# Patient Record
Sex: Female | Born: 1990 | Race: Asian | Hispanic: No | Marital: Married | Smoking: Never smoker
Health system: Southern US, Community
[De-identification: ages and names within clinical notes are randomized; demographics above are authoritative.]

## PROBLEM LIST (undated history)

## (undated) DIAGNOSIS — L732 Hidradenitis suppurativa: Secondary | ICD-10-CM

## (undated) DIAGNOSIS — O09299 Supervision of pregnancy with other poor reproductive or obstetric history, unspecified trimester: Secondary | ICD-10-CM

## (undated) DIAGNOSIS — D563 Thalassemia minor: Secondary | ICD-10-CM

## (undated) HISTORY — PX: SKIN GRAFT: SHX250

---

## 2010-07-03 ENCOUNTER — Ambulatory Visit: Payer: Self-pay | Admitting: Surgery

## 2010-07-05 ENCOUNTER — Observation Stay: Payer: Self-pay | Admitting: Surgery

## 2010-08-06 ENCOUNTER — Ambulatory Visit: Payer: Self-pay | Admitting: Surgery

## 2010-09-17 ENCOUNTER — Encounter: Payer: Self-pay | Admitting: Nurse Practitioner

## 2010-09-17 ENCOUNTER — Encounter: Payer: Self-pay | Admitting: Cardiothoracic Surgery

## 2010-09-18 ENCOUNTER — Encounter: Payer: Self-pay | Admitting: Cardiothoracic Surgery

## 2010-09-18 ENCOUNTER — Encounter: Payer: Self-pay | Admitting: Nurse Practitioner

## 2010-10-30 ENCOUNTER — Encounter (HOSPITAL_BASED_OUTPATIENT_CLINIC_OR_DEPARTMENT_OTHER): Payer: Self-pay | Attending: Plastic Surgery

## 2010-11-26 ENCOUNTER — Encounter (HOSPITAL_BASED_OUTPATIENT_CLINIC_OR_DEPARTMENT_OTHER): Payer: Self-pay | Attending: Plastic Surgery

## 2013-05-19 LAB — OB RESULTS CONSOLE RUBELLA ANTIBODY, IGM: Rubella: IMMUNE

## 2013-05-19 LAB — OB RESULTS CONSOLE GC/CHLAMYDIA
Chlamydia: NEGATIVE
GC PROBE AMP, GENITAL: NEGATIVE

## 2013-05-19 LAB — OB RESULTS CONSOLE RPR: RPR: NONREACTIVE

## 2013-05-19 LAB — OB RESULTS CONSOLE HIV ANTIBODY (ROUTINE TESTING): HIV: NONREACTIVE

## 2013-05-19 LAB — OB RESULTS CONSOLE HEPATITIS B SURFACE ANTIGEN: Hepatitis B Surface Ag: NEGATIVE

## 2013-05-19 LAB — OB RESULTS CONSOLE ABO/RH: RH Type: POSITIVE

## 2013-08-31 ENCOUNTER — Inpatient Hospital Stay (HOSPITAL_COMMUNITY): Admission: AD | Admit: 2013-08-31 | Payer: Self-pay | Source: Ambulatory Visit | Admitting: Obstetrics & Gynecology

## 2013-10-06 ENCOUNTER — Other Ambulatory Visit: Payer: Self-pay

## 2013-10-11 ENCOUNTER — Ambulatory Visit (HOSPITAL_COMMUNITY)
Admission: RE | Admit: 2013-10-11 | Discharge: 2013-10-11 | Disposition: A | Discharge status: Home / Self Care | Payer: Medicaid Other | Source: Ambulatory Visit | Attending: Obstetrics and Gynecology | Admitting: Obstetrics and Gynecology

## 2013-10-11 DIAGNOSIS — D563 Thalassemia minor: Secondary | ICD-10-CM

## 2013-10-13 DIAGNOSIS — D563 Thalassemia minor: Secondary | ICD-10-CM | POA: Insufficient documentation

## 2013-10-13 NOTE — Progress Notes (Signed)
Genetic Counseling  High-Risk Gestation Note  Appointment Date:  10/11/2013 Referred By: Delice Lesch, MD Date of Birth:  Oct 07, 1990    Pregnancy History: G1P0 Estimated Date of Delivery: 12/18/13 Estimated Gestational Age: [redacted]w[redacted]d Attending: Renella Cunas, MD   I met with Mrs. 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 She was accompanied by her mother to today's visit.   Hayley Herring had routine screening for hemoglobinopathies via hemoglobin electrophoresis and complete blood count based on her ancestry on 05/19/13, which revealed a hemoglobin A of 93.7%, hemoglobin A2 of 4.4%, and hemoglobin F of 1.9%.  Her MCV value was 59.5 and her MCH was 20.8.  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 Additionally, the patient's complete blood count identified a hemoglobin value of 9.5. Females with beta-thalassemia minor are reported to typically have hemoglobin values that range from 9-14.  We reviewed both family histories in detail. Hayley Herring reported that she was aware of her beta-thalassemia carrier status, having learned about it when she previously had surgery in 2012. Her father reportedly has beta-thalassemia trait (beta-thalassemia minor). She reported that her paternal aunt and possibly her paternal grandfather also have beta-thalassemia trait. The father of the pregnancy, [name], reportedly has no relatives with beta-thalassemia trait or beta-thalassemia, and consanguinity between the couple was denied. The patient reported that [name] had blood work performed recently when he moved to the [place] from [place]. She reported that complete blood count was included in the blood work and was normal. She is unsure if hemoglobin electrophoresis was included as well, but reported that her  father learned about his beta-thalassemia trait through the lab work that was required when he moved from [place] to the [place]. Hayley Herring was unsure if [name] would have a copy of these medical records, given that all of the records are submitted to the [facility]. She planned to inquire and contact our office if medical records are obtained.    [name] was counseled that beta-thalassemia major is a genetic condition characterized by reduced synthesis of the beta subunit of hemoglobin (beta globin). We reviewed that hemoglobin is a protein in red blood cells that carries oxygen to the body's organs. There are multiple types of hemoglobin, and these are comprised of different subunits.  The primary adult hemoglobin, denoted as hemoglobin A (Hb A) is made up of two alpha and two beta globin units. Individuals who have beta-thalassemia major have changes within the genes that code for beta globin. The resulting imbalance in the ratio of the alpha chains to the beta chains causes the underlying pathology. In beta-thalassemia, the alpha chains are produced in relative excess. Therefore, because of the absence of the beta chains with which to form a tetramer, the excess alpha chains will precipitate in the cell damaging the membrane and leading to premature RBC destruction. This results in hypochromic, microcytic anemia. The anemia is severe and typically leads to hepatosplenomegaly for individuals with beta thalassemia.  Because the beta chain is not used for hemoglobin production during fetal life, the onset of symptoms in beta-thalassemia is not until a few months of life. Beta thalassemia major is typically treated with regular transfusions and chelation therapy, aimed at reducing transfusion iron overload.  Without treatment, individuals with beta-thalassemia major have failure to thrive, feeding problems, and a shortened lifespan. Elevation  of hemoglobin A2 (alpha2/delta2) occurs uniquely in  beta-thalassemia carriers, and this is what is typically found on hemoglobin electrophoresis for beta thalassemia carriers. Continued production of the delta chains allows for tetramer formation between the alpha and delta chains. Beta-thalassemia produces a variable phenotype dependent upon the severity of the mutations.    Hayley Herring was counseled that beta-thalassemia is inherited in an autosomal recessive manner, and occurs when both copies of the beta hemoglobin gene are changed. Typically, one abnormal beta globin gene is inherited from each parent. We discussed that beta thalassemia trait can be inherited with other beta globin chain variants, such as sickle cell trait (Hb AS) to also cause other variant hemoglobinopathies, such as sickle beta-thalassemia.    Given the recessive inheritance, we discussed the importance of understanding the carrier status of the father of the pregnancy in order to accurately predict the risk of beta-thalassemia or other hemoglobinopathy in the fetus. If he does not have beta-thalassemia trait, as Hayley Herring reports, then the pregnancy would not be at increased risk for beta-thalassemia major but would have a 1 in 2 (50%) chance to have beta-thalassemia trait (beta-thalassemia minor). If screening was not performed, hemoglobin electrophoresis with a complete blood count and serum ferritin levels would be available to him to determine whether he has beta-thalassemia trait, or any hemoglobin variant (including sickle cell trait).   We discussed that in the case that both parents are identified to be carriers for hemoglobin variants, prenatal diagnosis via chorionic villus sampling (in the first trimester) or amniocentesis (after [redacted] weeks gestation) would be available, if desired. In the case of beta-thalassemia trait, molecular testing would first need to be performed to identify the causative mutation in the HBB gene prior to CVS or amniocentesis, given that >200 pathogenic  variants have been identified in the HBB gene. We discussed that HBB gene sequencing is available to Ms. Jewel to assess for the causative gene change for her beta thalassemia trait. HBB sequencing reportedly identifies a causative mutation in 99% of individuals with beta thalassemia.  We discussed the risks, benefits, and limitations of amniocentesis. The patient understands that targeted ultrasound in pregnancy would not be expected to see physical differences related to the presence or absence of a hemoglobinopathy. We also reviewed the availability of newborn screening in [place] for hemoglobinopathies.  The family histories were otherwise unremarkable for birth defects, intellectual disability, recurrent pregnancy loss, or other known genetic conditions.   Ms. Noga Fogg denied exposure to environmental toxins or chemical agents. She denied the use of alcohol, tobacco or street drugs. She denied significant viral illnesses during the course of her pregnancy. Her medical and surgical histories were contributory for beta-thalassemia minor, as discussed and past surgery for hidradenitis in 2012 and 2013.   I counseled Ms. Najiyah Paris regarding the above risks and available options.  The approximate face-to-face time with the genetic counselor was 35 minutes.  Chipper Oman, MS Certified Genetic Counselor 10/13/2013

## 2013-11-21 LAB — OB RESULTS CONSOLE GBS: GBS: POSITIVE

## 2013-12-15 ENCOUNTER — Encounter (HOSPITAL_COMMUNITY): Payer: Self-pay | Admitting: *Deleted

## 2013-12-15 ENCOUNTER — Inpatient Hospital Stay (HOSPITAL_COMMUNITY)
Admission: AD | Admit: 2013-12-15 | Discharge: 2013-12-19 | DRG: 766 | Disposition: A | Discharge status: Home / Self Care | Payer: Medicaid Other | Source: Ambulatory Visit | Attending: Obstetrics and Gynecology | Admitting: Obstetrics and Gynecology

## 2013-12-15 ENCOUNTER — Inpatient Hospital Stay (HOSPITAL_COMMUNITY): Payer: Medicaid Other | Admitting: Anesthesiology

## 2013-12-15 ENCOUNTER — Encounter (HOSPITAL_COMMUNITY): Payer: Medicaid Other | Admitting: Anesthesiology

## 2013-12-15 DIAGNOSIS — Z3A39 39 weeks gestation of pregnancy: Secondary | ICD-10-CM | POA: Diagnosis present

## 2013-12-15 DIAGNOSIS — Z6836 Body mass index (BMI) 36.0-36.9, adult: Secondary | ICD-10-CM | POA: Diagnosis not present

## 2013-12-15 DIAGNOSIS — O471 False labor at or after 37 completed weeks of gestation: Secondary | ICD-10-CM | POA: Diagnosis present

## 2013-12-15 DIAGNOSIS — L732 Hidradenitis suppurativa: Secondary | ICD-10-CM | POA: Diagnosis present

## 2013-12-15 DIAGNOSIS — D563 Thalassemia minor: Secondary | ICD-10-CM | POA: Diagnosis present

## 2013-12-15 DIAGNOSIS — O99824 Streptococcus B carrier state complicating childbirth: Secondary | ICD-10-CM | POA: Diagnosis present

## 2013-12-15 DIAGNOSIS — O99214 Obesity complicating childbirth: Secondary | ICD-10-CM | POA: Diagnosis present

## 2013-12-15 DIAGNOSIS — O9902 Anemia complicating childbirth: Secondary | ICD-10-CM | POA: Diagnosis present

## 2013-12-15 DIAGNOSIS — Z833 Family history of diabetes mellitus: Secondary | ICD-10-CM

## 2013-12-15 DIAGNOSIS — B951 Streptococcus, group B, as the cause of diseases classified elsewhere: Secondary | ICD-10-CM | POA: Diagnosis present

## 2013-12-15 HISTORY — DX: Hidradenitis suppurativa: L73.2

## 2013-12-15 HISTORY — DX: Thalassemia minor: D56.3

## 2013-12-15 LAB — CBC
HCT: 30.2 % — ABNORMAL LOW (ref 36.0–46.0)
Hemoglobin: 9.9 g/dL — ABNORMAL LOW (ref 12.0–15.0)
MCH: 20.3 pg — ABNORMAL LOW (ref 26.0–34.0)
MCHC: 32.8 g/dL (ref 30.0–36.0)
MCV: 62 fL — ABNORMAL LOW (ref 78.0–100.0)
PLATELETS: 287 10*3/uL (ref 150–400)
RBC: 4.87 MIL/uL (ref 3.87–5.11)
RDW: 16.4 % — ABNORMAL HIGH (ref 11.5–15.5)
WBC: 15.5 10*3/uL — ABNORMAL HIGH (ref 4.0–10.5)

## 2013-12-15 LAB — HIV ANTIBODY (ROUTINE TESTING W REFLEX): HIV: NONREACTIVE

## 2013-12-15 LAB — TYPE AND SCREEN
ABO/RH(D): AB POS
ANTIBODY SCREEN: NEGATIVE

## 2013-12-15 LAB — RPR

## 2013-12-15 LAB — ABO/RH: ABO/RH(D): AB POS

## 2013-12-15 LAB — AMNISURE RUPTURE OF MEMBRANE (ROM) NOT AT ARMC: Amnisure ROM: POSITIVE

## 2013-12-15 MED ORDER — EPHEDRINE 5 MG/ML INJ: 10.0000 mg | INTRAVENOUS | Status: DC | PRN

## 2013-12-15 MED ORDER — LACTATED RINGERS IV SOLN
INTRAVENOUS | Status: DC
  Administered 2013-12-15: 11:00:00 via INTRAVENOUS
  Administered 2013-12-15: 125 mL/h via INTRAVENOUS
  Administered 2013-12-16 (×3): via INTRAVENOUS

## 2013-12-15 MED ORDER — TERBUTALINE SULFATE 1 MG/ML IJ SOLN: 0.2500 mg | Freq: Once | INTRAMUSCULAR | Status: AC | PRN

## 2013-12-15 MED ORDER — OXYTOCIN BOLUS FROM INFUSION: 500.0000 mL | INTRAVENOUS | Status: DC

## 2013-12-15 MED ORDER — CITRIC ACID-SODIUM CITRATE 334-500 MG/5ML PO SOLN
30.0000 mL | ORAL | Status: DC | PRN
  Administered 2013-12-16: 30 mL via ORAL
  Filled 2013-12-15 (×2): qty 15

## 2013-12-15 MED ORDER — ONDANSETRON HCL 4 MG/2ML IJ SOLN: 4.0000 mg | Freq: Four times a day (QID) | INTRAMUSCULAR | Status: DC | PRN

## 2013-12-15 MED ORDER — BUTORPHANOL TARTRATE 1 MG/ML IJ SOLN
1.0000 mg | INTRAMUSCULAR | Status: DC | PRN
  Administered 2013-12-15 (×2): 1 mg via INTRAVENOUS
  Filled 2013-12-15 (×2): qty 1

## 2013-12-15 MED ORDER — LACTATED RINGERS IV SOLN
500.0000 mL | INTRAVENOUS | Status: DC | PRN
  Administered 2013-12-16: 500 mL via INTRAVENOUS

## 2013-12-15 MED ORDER — OXYCODONE-ACETAMINOPHEN 5-325 MG PO TABS: 2.0000 | ORAL_TABLET | ORAL | Status: DC | PRN

## 2013-12-15 MED ORDER — OXYTOCIN 40 UNITS IN LACTATED RINGERS INFUSION - SIMPLE MED
1.0000 m[IU]/min | INTRAVENOUS | Status: DC
  Administered 2013-12-15: 1 m[IU]/min via INTRAVENOUS

## 2013-12-15 MED ORDER — LIDOCAINE HCL (PF) 1 % IJ SOLN
INTRAMUSCULAR | Status: DC | PRN
  Administered 2013-12-15: 6 mL
  Administered 2013-12-15: 4 mL

## 2013-12-15 MED ORDER — OXYCODONE-ACETAMINOPHEN 5-325 MG PO TABS: 1.0000 | ORAL_TABLET | ORAL | Status: DC | PRN

## 2013-12-15 MED ORDER — ACETAMINOPHEN 325 MG PO TABS: 650.0000 mg | ORAL_TABLET | ORAL | Status: DC | PRN

## 2013-12-15 MED ORDER — MISOPROSTOL 25 MCG QUARTER TABLET: 25.0000 ug | ORAL_TABLET | ORAL | Status: DC | PRN

## 2013-12-15 MED ORDER — PHENYLEPHRINE 40 MCG/ML (10ML) SYRINGE FOR IV PUSH (FOR BLOOD PRESSURE SUPPORT)
80.0000 ug | PREFILLED_SYRINGE | INTRAVENOUS | Status: DC | PRN
  Filled 2013-12-15: qty 10

## 2013-12-15 MED ORDER — PENICILLIN G POTASSIUM 5000000 UNITS IJ SOLR
2.5000 10*6.[IU] | INTRAVENOUS | Status: DC
  Administered 2013-12-15 – 2013-12-16 (×5): 2.5 10*6.[IU] via INTRAVENOUS
  Filled 2013-12-15 (×8): qty 2.5

## 2013-12-15 MED ORDER — PHENYLEPHRINE 40 MCG/ML (10ML) SYRINGE FOR IV PUSH (FOR BLOOD PRESSURE SUPPORT): 80.0000 ug | PREFILLED_SYRINGE | INTRAVENOUS | Status: DC | PRN

## 2013-12-15 MED ORDER — LIDOCAINE HCL (PF) 1 % IJ SOLN
30.0000 mL | INTRAMUSCULAR | Status: DC | PRN
  Filled 2013-12-15: qty 30

## 2013-12-15 MED ORDER — OXYTOCIN 40 UNITS IN LACTATED RINGERS INFUSION - SIMPLE MED
62.5000 mL/h | INTRAVENOUS | Status: DC
  Filled 2013-12-15: qty 1000

## 2013-12-15 MED ORDER — FLEET ENEMA 7-19 GM/118ML RE ENEM: 1.0000 | ENEMA | RECTAL | Status: DC | PRN

## 2013-12-15 MED ORDER — FENTANYL 2.5 MCG/ML BUPIVACAINE 1/10 % EPIDURAL INFUSION (WH - ANES)
14.0000 mL/h | INTRAMUSCULAR | Status: DC | PRN
  Administered 2013-12-15 – 2013-12-16 (×3): 14 mL/h via EPIDURAL
  Filled 2013-12-15 (×4): qty 125

## 2013-12-15 MED ORDER — DIPHENHYDRAMINE HCL 50 MG/ML IJ SOLN: 12.5000 mg | INTRAMUSCULAR | Status: DC | PRN

## 2013-12-15 MED ORDER — ZOLPIDEM TARTRATE 5 MG PO TABS: 5.0000 mg | ORAL_TABLET | Freq: Every evening | ORAL | Status: DC | PRN

## 2013-12-15 MED ORDER — FENTANYL 2.5 MCG/ML BUPIVACAINE 1/10 % EPIDURAL INFUSION (WH - ANES): 14.0000 mL/h | INTRAMUSCULAR | Status: DC | PRN

## 2013-12-15 MED ORDER — LACTATED RINGERS IV SOLN
500.0000 mL | Freq: Once | INTRAVENOUS | Status: AC
  Administered 2013-12-15: 500 mL via INTRAVENOUS

## 2013-12-15 MED ORDER — PENICILLIN G POTASSIUM 5000000 UNITS IJ SOLR
5.0000 10*6.[IU] | Freq: Once | INTRAVENOUS | Status: AC
  Administered 2013-12-15: 5 10*6.[IU] via INTRAVENOUS
  Filled 2013-12-15: qty 5

## 2013-12-15 NOTE — Progress Notes (Signed)
Hayley Herring MRN: [id number]  Subjective: -Patient continues to experience pain in left leg.   Objective: BP 138/81  Pulse 93  Temp(Src) 99.1 F (37.3 C) (Axillary)  Resp 18  Ht 4\' 11"  (1.499 m)  Wt 179 lb (81.194 kg)  BMI 36.13 kg/m2  SpO2 97%   Total I/O In: -  Out: 650 [Urine:650] FHT: 150 bpm, Mod Var, -Decels, +Accels UC:  Q2-10min, MVUs 160, palpates moderate to strong  SVE:   Dilation: 8 Effacement (%): 90 Station: -2 Exam by:: Colie Josten cnm Membranes:SROM x 16hrs Pitocin:86mUn/min  Assessment:  IUP at 39.4wks Cat I FT  GBS Positive Active Labor Pitocin Augmentation  Plan: -Position change to promote fetal descent -Continue other mgmt as ordered -Will continue to observe  -Will update Dr. Berenda Morale, Francesca Jewett, MSN 12/15/2013, 11:32 PM

## 2013-12-15 NOTE — Progress Notes (Signed)
  Subjective: Increased pain with UCs, but UCs still irregular.  Patient also has difficulty with pelvic exams.  Has received 2 doses IV pain med with brief relief.  Objective: BP 133/83  Pulse 78  Temp(Src) 98.4 F (36.9 C) (Oral)  Resp 18  Ht 4\' 11"  (1.499 m)  Wt 179 lb (81.194 kg)  BMI 36.13 kg/m2      FHT: Category 1 UC:   irregular, every 3-6 minutes SVE:   Dilation: 3.5 Effacement (%): 90 Station: -2 Exam by:: v.Teffany Blaszczyk,RN    Assessment:  Early labor GBS positive SROM at 7am  Plan: Reviewed status with patient and family--I recommend pitocin and epidural analgesia. Patient and family agreeable with plan.  Donnel Saxon CNM 12/15/2013, 3:17 PM

## 2013-12-15 NOTE — Progress Notes (Signed)
Hayley Herring MRN: [id number]  Subjective: -Patient reports some discomfort in left leg.  PCA dose given and repositioned.    Objective: BP 118/61  Pulse 64  Temp(Src) 98 F (36.7 C) (Oral)  Resp 18  Ht 4\' 11"  (1.499 m)  Wt 179 lb (81.194 kg)  BMI 36.13 kg/m2  SpO2 97%     FHT: 145 bpm, Mod Var, + Variable Decels, +Accels UC:   Q2-41min, MVUs 180 SVE:   Dilation: 6 Effacement (%): 90 Station: -2 Exam by:: Hayley Herring cnm Membranes: SROM x 14hrs Pitocin: 11mUn/min IUPC in place Pelvis; Narrow Outlet  Assessment:  IUP at 39.4wks Cat II FT  GBS Positive Active Labor  Plan: -Cat II FT resolved with position change -Discussed narrow pelvic outlet--assured patient that will provide every opportunity for vaginal delivery -Patient questions and expresses concern re; whether she will have c/s, educated on uncertainty of delivery mode outcome, but remain hopeful and supportive of vaginal delivery.  Patient reassured by this. -Will continue to observe -Continue other mgmt as ordered  [facility], Hayley Herring,CNM, MSN 12/15/2013, 8:56 PM

## 2013-12-15 NOTE — MAU Note (Signed)
Started leaking white clear discharge @ 0700, lower abd cramping every 7 minutes.  Leaking continues, is thick "gooey."  Denies bleeding.

## 2013-12-15 NOTE — Anesthesia Procedure Notes (Signed)

## 2013-12-15 NOTE — Anesthesia Preprocedure Evaluation (Addendum)
Anesthesia Evaluation  Patient identified by MRN, date of birth, ID band Patient awake    Reviewed: Allergy & Precautions, H&P , Patient's Chart, lab work & pertinent test results  Airway Mallampati: II  TM Distance: >3 FB Neck ROM: full    Dental  (+) Teeth Intact   Pulmonary  breath sounds clear to auscultation        Cardiovascular Rhythm:regular Rate:Normal     Neuro/Psych    GI/Hepatic   Endo/Other  Morbid obesity  Renal/GU      Musculoskeletal   Abdominal   Peds  Hematology  (+) anemia ,   Anesthesia Other Findings       Reproductive/Obstetrics (+) Pregnancy                             Anesthesia Physical Anesthesia Plan  ASA: III and emergent  Anesthesia Plan: Epidural   Post-op Pain Management:    Induction:   Airway Management Planned: Natural Airway  Additional Equipment:   Intra-op Plan:   Post-operative Plan:   Informed Consent: I have reviewed the patients History and Physical, chart, labs and discussed the procedure including the risks, benefits and alternatives for the proposed anesthesia with the patient or authorized representative who has indicated his/her understanding and acceptance.   Dental Advisory Given  Plan Discussed with: Anesthesiologist, CRNA and Surgeon  Anesthesia Plan Comments: (Labs checked- platelets confirmed with RN in room. Fetal heart tracing, per RN, reported to be stable enough for sitting procedure. Discussed epidural, and patient consents to the procedure:  included risk of possible headache,backache, failed block, allergic reaction, and nerve injury. This patient was asked if she had any questions or concerns before the procedure started. Patient for C/Section for arrest of descent. Will use epidural for C/Section. M. Royce Macadamia, MD)       Anesthesia Quick Evaluation

## 2013-12-15 NOTE — H&P (Signed)
Hayley Herring is a 23 y.o. female, G1P0 at 72 4/7 weeks, presenting for contractions since 7am, with ? SROM at that time.  Denies bleeding, reports d/c is thick and mucusy.  Reports +FM.  Amnisure was positive in MAU, now admitted to [facility] for labor care.  Patient Active Problem List   Diagnosis Date Noted  . Positive GBS test 12/15/2013  . Hydradenitis 12/15/2013  . Thalassemia trait, 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    History of present pregnancy: Patient entered care at 22-23 weeks as transfer from [facility] due to insurance issues.   EDC of 12/18/13 was established by LMP and in agreement with [place] at 18 1/7 weeks.   Anatomy scan:  18 1/7 weeks, with normal findings and a posterior placenta.   Additional [place] evaluations:  None   Significant prenatal events:  Several episodes of hydradenitis suppurativa during pregnancy, with extensive hx of this in past.  Areas generally open and drain spontaneously, has not required ATB during pregnancy. Last evaluation:  12/12/13--cervix closed, hydradenitis stable, single lesion draining small amount.  OB History   Grav Para Term Preterm Abortions TAB SAB Ect Mult Living   1              Past Medical History  Diagnosis Date  . Beta thalassemia minor   . Hidradenitis suppurativa    Surgical Hx:  Drainage of hydradenitis lesions in 2012 and 2014--removal of gland  Family History: MA esophageal cancer; Father migraines; Mother Type 2 DM  Social History:  reports that she has never smoked. She does not have any smokeless tobacco history on file. She reports that she does not drink alcohol or use illicit drugs.Patient is [place], married to FOB [facility]), is full-time Ship broker, Muslim in religion.   Prenatal Transfer Tool  Maternal Diabetes: No Genetic Screening: Normal Maternal Ultrasounds/Referrals: Normal Quad screen Fetal Ultrasounds or other Referrals:  None Maternal Substance Abuse:  No Significant Maternal Medications:   None Significant Maternal Lab Results: Lab values include: Group B Strep positive; Beta thalassemia minor (FOB not sure if he has been tested, but no hx of beta thalassemia in his hx)  TDAP 10/01/13 Flu vaccine 11/21/13   ROS:  Contractions, + leaking fluid, +FM.  No Known Allergies   Dilation: 3 Effacement (%): 90 Station: -2 Exam by:: VCira Servant CNM Blood pressure 115/65, pulse 92, temperature 98.4 F (36.9 C), temperature source Oral, resp. rate 20.  Chest clear Heart RRR without murmur Abd gravid, NT, FH 39 cm Pelvic: As above--draining hydradenitis lesion on right labia majora, NT.  Right labia indurated, NT Ext: WNL  FHR: Category 1 UCs:  q 3-6 min, mild/moderate  Prenatal labs: ABO, Rh:  AB+ Antibody:  Neg Rubella:   Immune RPR:   NR HBsAg:   Neg HIV:   NR GBS: Positive (10/05 0000) Sickle cell/Hgb electrophoresis:  Beta thalassemia minor Pap:  WNL 08/09/13 GC:  Negative 08/09/13 and 11/21/13 Chlamydia:  Negative 08/09/13 and 11/21/13 Genetic screenings:  Normal Quad screen at [place]:  WNL Other:  Hgb 8.9 on 10/03/13, 9.5 on 05/19/13.    Assessment/Plan: IUP at 39 4/7 weeks Early labor SROM Hydratinitis suppuritiva GBS positive  Plan: Admit to [place] per consult with Dr. Raphael Gibney Routine [facility] orders T&S with admit labs GBS prophylaxis with PCN G. Pain med/epidural prn. Patient wants to ambulate initially for pain management.   [place], [place], [place] 12/15/2013, 10:24 AM

## 2013-12-15 NOTE — Progress Notes (Signed)
  Subjective: Still holding in MAU due to no bed space in MAU.  Has been ambulating.  Received 1st dose of PCN at 1130.  Objective: BP 115/65  Pulse 83  Temp(Src) 98 F (36.7 C) (Oral)  Resp 18      FHT: Category 1 on current tracing--intermittent monitoring during ambulation. UC:   irregular, every 4-6 min SVE:   Dilation: 3 Effacement (%): 90 Station: -2 Exam by:: Windy Kalata CNM at 1012  Assessment:  SROM at 7a, latent/early labor GBS positive  Plan: Await bed on [place]. Augmentation if no increase in labor status by 3 pm.  Donnel Saxon CNM 12/15/2013, 12:21 PM

## 2013-12-15 NOTE — Progress Notes (Signed)
  Subjective: Comfortable with epidural.  Sleeping at intervals.  Objective: BP 125/73  Pulse 80  Temp(Src) 98.4 F (36.9 C) (Oral)  Resp 20  Ht 4\' 11"  (1.499 m)  Wt 179 lb (81.194 kg)  BMI 36.13 kg/m2  SpO2 97%      Results for orders placed during the hospital encounter of 12/15/13 (from the past 24 hour(s))  AMNISURE RUPTURE OF MEMBRANE (ROM)     Status: None   Collection Time    12/15/13 10:13 AM      Result Value Ref Range   Amnisure ROM POSITIVE    TYPE AND SCREEN     Status: None   Collection Time    12/15/13 11:00 AM      Result Value Ref Range   ABO/RH(D) AB POS     Antibody Screen NEG     Sample Expiration 12/18/2013    HIV ANTIBODY (ROUTINE TESTING)     Status: None   Collection Time    12/15/13 11:00 AM      Result Value Ref Range   HIV 1&2 Ab, 4th Generation NONREACTIVE  NONREACTIVE  CBC     Status: Abnormal   Collection Time    12/15/13 11:00 AM      Result Value Ref Range   WBC 15.5 (*) 4.0 - 10.5 K/uL   RBC 4.87  3.87 - 5.11 MIL/uL   Hemoglobin 9.9 (*) 12.0 - 15.0 g/dL   HCT 30.2 (*) 36.0 - 46.0 %   MCV 62.0 (*) 78.0 - 100.0 fL   MCH 20.3 (*) 26.0 - 34.0 pg   MCHC 32.8  30.0 - 36.0 g/dL   RDW 16.4 (*) 11.5 - 15.5 %   Platelets 287  150 - 400 K/uL  RPR     Status: None   Collection Time    12/15/13 11:00 AM      Result Value Ref Range   RPR NON REAC  NON REAC  ABO/RH     Status: None   Collection Time    12/15/13 11:00 AM      Result Value Ref Range   ABO/RH(D) AB POS       FHT: Category 1 UC:   irregular, every 2-5 minutes SVE:   Dilation: 4.5 Effacement (%): 90 Station: -2 Exam by:: V.Lilas Diefendorf,CNM IUPC placed without difficulty Foley placed without difficulty Pelvic outlet narrow. Pitocin at 4 mu/min  Assessment:  Early labor GBS positive SROM since 7am Beta thalassemia minor--Hgb 9.9  Plan: Continue augmentation to establish/maintain adequacy.  Donnel Saxon CNM 12/15/2013, 6:17 PM

## 2013-12-16 ENCOUNTER — Encounter (HOSPITAL_COMMUNITY)
Admission: AD | Disposition: A | Discharge status: Home / Self Care | Payer: Self-pay | Source: Ambulatory Visit | Attending: Obstetrics and Gynecology

## 2013-12-16 ENCOUNTER — Encounter (HOSPITAL_COMMUNITY): Payer: Self-pay | Admitting: *Deleted

## 2013-12-16 LAB — COMPREHENSIVE METABOLIC PANEL
ALBUMIN: 2.5 g/dL — AB (ref 3.5–5.2)
ALK PHOS: 148 U/L — AB (ref 39–117)
ALT: 10 U/L (ref 0–35)
AST: 19 U/L (ref 0–37)
Anion gap: 14 (ref 5–15)
BILIRUBIN TOTAL: 0.5 mg/dL (ref 0.3–1.2)
BUN: 6 mg/dL (ref 6–23)
CO2: 20 mEq/L (ref 19–32)
Calcium: 8.7 mg/dL (ref 8.4–10.5)
Chloride: 104 mEq/L (ref 96–112)
Creatinine, Ser: 0.73 mg/dL (ref 0.50–1.10)
GFR calc Af Amer: >90 mL/min (ref >90)
GFR calc non Af Amer: >90 mL/min (ref >90)
Glucose, Bld: 104 mg/dL — ABNORMAL HIGH (ref 70–99)
POTASSIUM: 3.9 meq/L (ref 3.7–5.3)
SODIUM: 138 meq/L (ref 137–147)
Total Protein: 6.6 g/dL (ref 6.0–8.3)

## 2013-12-16 LAB — PROTEIN / CREATININE RATIO, URINE
Creatinine, Urine: 64.32 mg/dL
PROTEIN CREATININE RATIO: 5.39 — AB (ref 0.00–0.15)
TOTAL PROTEIN, URINE: 346.5 mg/dL

## 2013-12-16 LAB — URIC ACID: Uric Acid, Serum: 5 mg/dL (ref 2.4–7.0)

## 2013-12-16 LAB — LACTATE DEHYDROGENASE: LDH: 175 U/L (ref 94–250)

## 2013-12-16 SURGERY — Surgical Case
Anesthesia: Epidural

## 2013-12-16 MED ORDER — KETOROLAC TROMETHAMINE 30 MG/ML IJ SOLN
30.0000 mg | Freq: Four times a day (QID) | INTRAMUSCULAR | Status: AC | PRN
Start: 2013-12-16 — End: 2013-12-17
  Administered 2013-12-16: 30 mg via INTRAMUSCULAR

## 2013-12-16 MED ORDER — SCOPOLAMINE 1 MG/3DAYS TD PT72
1.0000 | MEDICATED_PATCH | Freq: Once | TRANSDERMAL | Status: AC
  Administered 2013-12-16: 1.5 mg via TRANSDERMAL

## 2013-12-16 MED ORDER — KETOROLAC TROMETHAMINE 30 MG/ML IJ SOLN: 30.0000 mg | Freq: Four times a day (QID) | INTRAMUSCULAR | Status: AC | PRN

## 2013-12-16 MED ORDER — NALBUPHINE HCL 10 MG/ML IJ SOLN: 5.0000 mg | Freq: Once | INTRAMUSCULAR | Status: AC | PRN

## 2013-12-16 MED ORDER — BUPIVACAINE-EPINEPHRINE (PF) 0.5% -1:200000 IJ SOLN
INTRAMUSCULAR | Status: AC
  Filled 2013-12-16: qty 30

## 2013-12-16 MED ORDER — ONDANSETRON HCL 4 MG/2ML IJ SOLN: 4.0000 mg | INTRAMUSCULAR | Status: DC | PRN

## 2013-12-16 MED ORDER — TETANUS-DIPHTH-ACELL PERTUSSIS 5-2.5-18.5 LF-MCG/0.5 IM SUSP: 0.5000 mL | Freq: Once | INTRAMUSCULAR | Status: DC

## 2013-12-16 MED ORDER — SODIUM CHLORIDE 0.9 % IR SOLN
Status: DC | PRN
  Administered 2013-12-16: 1000 mL

## 2013-12-16 MED ORDER — PRENATAL MULTIVITAMIN CH
1.0000 | ORAL_TABLET | Freq: Every day | ORAL | Status: DC
  Administered 2013-12-17 – 2013-12-19 (×3): 1 via ORAL
  Filled 2013-12-16 (×3): qty 1

## 2013-12-16 MED ORDER — SODIUM BICARBONATE 8.4 % IV SOLN
INTRAVENOUS | Status: AC
  Filled 2013-12-16: qty 50

## 2013-12-16 MED ORDER — ZOLPIDEM TARTRATE 5 MG PO TABS: 5.0000 mg | ORAL_TABLET | Freq: Every evening | ORAL | Status: DC | PRN

## 2013-12-16 MED ORDER — PRENATAL MULTIVITAMIN CH: 1.0000 | ORAL_TABLET | Freq: Every day | ORAL | Status: DC

## 2013-12-16 MED ORDER — MEASLES, MUMPS & RUBELLA VAC ~~LOC~~ INJ: 0.5000 mL | INJECTION | Freq: Once | SUBCUTANEOUS | Status: DC

## 2013-12-16 MED ORDER — LANOLIN HYDROUS EX OINT: 1.0000 "application " | TOPICAL_OINTMENT | CUTANEOUS | Status: DC | PRN

## 2013-12-16 MED ORDER — OXYTOCIN 10 UNIT/ML IJ SOLN
INTRAMUSCULAR | Status: AC
  Filled 2013-12-16: qty 4

## 2013-12-16 MED ORDER — LACTATED RINGERS IV SOLN
INTRAVENOUS | Status: DC
  Administered 2013-12-16 (×2): via INTRAVENOUS

## 2013-12-16 MED ORDER — MEDROXYPROGESTERONE ACETATE 150 MG/ML IM SUSP: 150.0000 mg | INTRAMUSCULAR | Status: DC | PRN

## 2013-12-16 MED ORDER — CEFAZOLIN SODIUM-DEXTROSE 2-3 GM-% IV SOLR
INTRAVENOUS | Status: DC | PRN
  Administered 2013-12-16: 2 g via INTRAVENOUS

## 2013-12-16 MED ORDER — OXYCODONE-ACETAMINOPHEN 5-325 MG PO TABS
1.0000 | ORAL_TABLET | ORAL | Status: DC | PRN
  Administered 2013-12-18 (×2): 1 via ORAL
  Filled 2013-12-16 (×2): qty 1

## 2013-12-16 MED ORDER — MORPHINE SULFATE 0.5 MG/ML IJ SOLN
INTRAMUSCULAR | Status: AC
  Filled 2013-12-16: qty 10

## 2013-12-16 MED ORDER — ONDANSETRON HCL 4 MG/2ML IJ SOLN
INTRAMUSCULAR | Status: DC | PRN
  Administered 2013-12-16: 4 mg via INTRAVENOUS

## 2013-12-16 MED ORDER — SODIUM CHLORIDE 0.9 % IJ SOLN: 3.0000 mL | INTRAMUSCULAR | Status: DC | PRN

## 2013-12-16 MED ORDER — OXYTOCIN 10 UNIT/ML IJ SOLN
40.0000 [IU] | INTRAVENOUS | Status: DC | PRN
  Administered 2013-12-16: 40 [IU] via INTRAVENOUS

## 2013-12-16 MED ORDER — ONDANSETRON HCL 4 MG/2ML IJ SOLN: 4.0000 mg | Freq: Three times a day (TID) | INTRAMUSCULAR | Status: DC | PRN

## 2013-12-16 MED ORDER — MENTHOL 3 MG MT LOZG
1.0000 | LOZENGE | OROMUCOSAL | Status: DC | PRN
Start: 2013-12-16 — End: 2013-12-19

## 2013-12-16 MED ORDER — KETOROLAC TROMETHAMINE 30 MG/ML IJ SOLN
INTRAMUSCULAR | Status: AC
  Administered 2013-12-16: 30 mg via INTRAMUSCULAR
  Filled 2013-12-16: qty 1

## 2013-12-16 MED ORDER — WITCH HAZEL-GLYCERIN EX PADS
1.0000 | MEDICATED_PAD | CUTANEOUS | Status: DC | PRN
Start: 2013-12-16 — End: 2013-12-19

## 2013-12-16 MED ORDER — NALBUPHINE HCL 10 MG/ML IJ SOLN: 5.0000 mg | INTRAMUSCULAR | Status: DC | PRN

## 2013-12-16 MED ORDER — SIMETHICONE 80 MG PO CHEW
80.0000 mg | CHEWABLE_TABLET | ORAL | Status: DC
  Administered 2013-12-17 – 2013-12-19 (×3): 80 mg via ORAL
  Filled 2013-12-16 (×3): qty 1

## 2013-12-16 MED ORDER — OXYTOCIN 40 UNITS IN LACTATED RINGERS INFUSION - SIMPLE MED: 62.5000 mL/h | INTRAVENOUS | Status: AC

## 2013-12-16 MED ORDER — IBUPROFEN 600 MG PO TABS
600.0000 mg | ORAL_TABLET | Freq: Four times a day (QID) | ORAL | Status: DC
  Administered 2013-12-16 – 2013-12-19 (×12): 600 mg via ORAL
  Filled 2013-12-16 (×12): qty 1

## 2013-12-16 MED ORDER — MORPHINE SULFATE (PF) 0.5 MG/ML IJ SOLN
INTRAMUSCULAR | Status: DC | PRN
  Administered 2013-12-16: 4 mg via EPIDURAL

## 2013-12-16 MED ORDER — FENTANYL CITRATE 0.05 MG/ML IJ SOLN: 25.0000 ug | INTRAMUSCULAR | Status: DC | PRN

## 2013-12-16 MED ORDER — NALOXONE HCL 1 MG/ML IJ SOLN: 1.0000 ug/kg/h | INTRAVENOUS | Status: DC | PRN

## 2013-12-16 MED ORDER — SCOPOLAMINE 1 MG/3DAYS TD PT72
MEDICATED_PATCH | TRANSDERMAL | Status: AC
  Administered 2013-12-16: 1.5 mg via TRANSDERMAL
  Filled 2013-12-16: qty 1

## 2013-12-16 MED ORDER — SENNOSIDES-DOCUSATE SODIUM 8.6-50 MG PO TABS
2.0000 | ORAL_TABLET | ORAL | Status: DC
  Administered 2013-12-17 – 2013-12-19 (×3): 2 via ORAL
  Filled 2013-12-16 (×3): qty 2

## 2013-12-16 MED ORDER — DIPHENHYDRAMINE HCL 50 MG/ML IJ SOLN: 12.5000 mg | INTRAMUSCULAR | Status: DC | PRN

## 2013-12-16 MED ORDER — SIMETHICONE 80 MG PO CHEW
80.0000 mg | CHEWABLE_TABLET | Freq: Three times a day (TID) | ORAL | Status: DC
  Administered 2013-12-16 – 2013-12-19 (×8): 80 mg via ORAL
  Filled 2013-12-16 (×8): qty 1

## 2013-12-16 MED ORDER — DIBUCAINE 1 % RE OINT
1.0000 | TOPICAL_OINTMENT | RECTAL | Status: DC | PRN
Start: 2013-12-16 — End: 2013-12-19

## 2013-12-16 MED ORDER — ONDANSETRON HCL 4 MG/2ML IJ SOLN
INTRAMUSCULAR | Status: AC
  Filled 2013-12-16: qty 2

## 2013-12-16 MED ORDER — OXYCODONE-ACETAMINOPHEN 5-325 MG PO TABS: 2.0000 | ORAL_TABLET | ORAL | Status: DC | PRN

## 2013-12-16 MED ORDER — DIPHENHYDRAMINE HCL 25 MG PO CAPS: 25.0000 mg | ORAL_CAPSULE | Freq: Four times a day (QID) | ORAL | Status: DC | PRN

## 2013-12-16 MED ORDER — LIDOCAINE-EPINEPHRINE (PF) 2 %-1:200000 IJ SOLN
INTRAMUSCULAR | Status: AC
  Filled 2013-12-16: qty 20

## 2013-12-16 MED ORDER — NALOXONE HCL 0.4 MG/ML IJ SOLN: 0.4000 mg | INTRAMUSCULAR | Status: DC | PRN

## 2013-12-16 MED ORDER — FERROUS SULFATE 325 (65 FE) MG PO TABS: 325.0000 mg | ORAL_TABLET | Freq: Every day | ORAL | Status: DC

## 2013-12-16 MED ORDER — ONDANSETRON HCL 4 MG PO TABS: 4.0000 mg | ORAL_TABLET | ORAL | Status: DC | PRN

## 2013-12-16 MED ORDER — SODIUM BICARBONATE 8.4 % IV SOLN
INTRAVENOUS | Status: DC | PRN
  Administered 2013-12-16 (×3): 5 mL via EPIDURAL

## 2013-12-16 MED ORDER — CEFAZOLIN SODIUM-DEXTROSE 2-3 GM-% IV SOLR
INTRAVENOUS | Status: AC
  Filled 2013-12-16: qty 50

## 2013-12-16 MED ORDER — DIPHENHYDRAMINE HCL 25 MG PO CAPS: 25.0000 mg | ORAL_CAPSULE | ORAL | Status: DC | PRN

## 2013-12-16 MED ORDER — ACETAMINOPHEN 500 MG PO TABS
1000.0000 mg | ORAL_TABLET | Freq: Once | ORAL | Status: AC
  Administered 2013-12-16: 1000 mg via ORAL
  Filled 2013-12-16: qty 2

## 2013-12-16 MED ORDER — BUPIVACAINE-EPINEPHRINE 0.5% -1:200000 IJ SOLN
INTRAMUSCULAR | Status: DC | PRN
  Administered 2013-12-16: 10 mL

## 2013-12-16 MED ORDER — MEPERIDINE HCL 25 MG/ML IJ SOLN: 6.2500 mg | INTRAMUSCULAR | Status: DC | PRN

## 2013-12-16 MED ORDER — SIMETHICONE 80 MG PO CHEW: 80.0000 mg | CHEWABLE_TABLET | ORAL | Status: DC | PRN

## 2013-12-16 MED ORDER — MORPHINE SULFATE (PF) 0.5 MG/ML IJ SOLN
INTRAMUSCULAR | Status: DC | PRN
  Administered 2013-12-16: 1 mg via INTRAVENOUS

## 2013-12-16 SURGICAL SUPPLY — 40 items
CLAMP CORD UMBIL (MISCELLANEOUS) IMPLANT
CLOTH BEACON ORANGE TIMEOUT ST (SAFETY) ×3 IMPLANT
CONTAINER PREFILL 10% NBF 15ML (MISCELLANEOUS) IMPLANT
DRAIN JACKSON PRT FLT 7MM (DRAIN) IMPLANT
DRAPE SHEET LG 3/4 BI-LAMINATE (DRAPES) IMPLANT
DRSG OPSITE POSTOP 4X10 (GAUZE/BANDAGES/DRESSINGS) ×3 IMPLANT
DURAPREP 26ML APPLICATOR (WOUND CARE) ×3 IMPLANT
ELECT REM PT RETURN 9FT ADLT (ELECTROSURGICAL) ×3
ELECTRODE REM PT RTRN 9FT ADLT (ELECTROSURGICAL) ×1 IMPLANT
EVACUATOR SILICONE 100CC (DRAIN) IMPLANT
EXTRACTOR VACUUM M CUP 4 TUBE (SUCTIONS) IMPLANT
EXTRACTOR VACUUM M CUP 4' TUBE (SUCTIONS)
GLOVE BIOGEL PI IND STRL 8.5 (GLOVE) ×1 IMPLANT
GLOVE BIOGEL PI INDICATOR 8.5 (GLOVE) ×2
GLOVE ECLIPSE 8.0 STRL XLNG CF (GLOVE) ×6 IMPLANT
GOWN STRL REUS W/TWL LRG LVL3 (GOWN DISPOSABLE) ×9 IMPLANT
KIT ABG SYR 3ML LUER SLIP (SYRINGE) IMPLANT
NEEDLE HYPO 25X1 1.5 SAFETY (NEEDLE) ×3 IMPLANT
NEEDLE HYPO 25X5/8 SAFETYGLIDE (NEEDLE) IMPLANT
PACK C SECTION WH (CUSTOM PROCEDURE TRAY) ×3 IMPLANT
PAD ABD 8X7 1/2 STERILE (GAUZE/BANDAGES/DRESSINGS) ×3 IMPLANT
PAD OB MATERNITY 4.3X12.25 (PERSONAL CARE ITEMS) ×3 IMPLANT
RINGERS IRRIG 1000ML POUR BTL (IV SOLUTION) ×3 IMPLANT
SPONGE GAUZE 4X4 12PLY STER LF (GAUZE/BANDAGES/DRESSINGS) ×3 IMPLANT
STAPLER VISISTAT 35W (STAPLE) IMPLANT
SUT MNCRL AB 3-0 PS2 27 (SUTURE) ×3 IMPLANT
SUT PLAIN 0 NONE (SUTURE) IMPLANT
SUT SILK 3 0 FS 1X18 (SUTURE) IMPLANT
SUT VIC AB 0 CT1 27 (SUTURE) ×4
SUT VIC AB 0 CT1 27XBRD ANBCTR (SUTURE) ×2 IMPLANT
SUT VIC AB 2-0 CTX 36 (SUTURE) ×6 IMPLANT
SUT VIC AB 3-0 CT1 27 (SUTURE)
SUT VIC AB 3-0 CT1 TAPERPNT 27 (SUTURE) IMPLANT
SUT VIC AB 3-0 SH 27 (SUTURE)
SUT VIC AB 3-0 SH 27X BRD (SUTURE) IMPLANT
SYR CONTROL 10ML LL (SYRINGE) ×3 IMPLANT
TAPE CLOTH SURG 4X10 WHT LF (GAUZE/BANDAGES/DRESSINGS) ×3 IMPLANT
TOWEL OR 17X24 6PK STRL BLUE (TOWEL DISPOSABLE) ×3 IMPLANT
TRAY FOLEY CATH 14FR (SET/KITS/TRAYS/PACK) ×3 IMPLANT
WATER STERILE IRR 1000ML POUR (IV SOLUTION) ×3 IMPLANT

## 2013-12-16 NOTE — Progress Notes (Signed)
Labor Progress  Subjective: No complaints. The likelyhood of a vaginal delivery was discussed with the pt and in agreement to proceed with primary CS  Objective: BP 132/67  Pulse 73  Temp(Src) 98.7 F (37.1 C) (Oral)  Resp 20  Ht 4\' 11"  (1.499 m)  Wt 81.194 kg (179 lb)  BMI 36.13 kg/m2  SpO2 97% I/O last 3 completed shifts: In: -  Out: 690 [Urine:690]   FHT: 150, moderate variability, occasional accel, early and variable decels CTX:  regular, every 4-5 minutes Uterus gravid, soft non tender SVE:  Dilation: 10 Effacement (%): 100 Station: +2 Exam by:: Delanie Tirrell,CNM Pitocin at 22mUn/min  Assessment:  IUP at 39.5 weeks NICHD: Category 2 Membranes:  SROM x 27hrs, no s/s of infection Labor progress: FTD Pitocin DC's GBS: positive Ineffective pushing possibility dt to fetal position or CPD   Plan: Proceed with primary CS Dr Raphael Gibney consulted     Hayley Herring, CNM, MSN 12/16/2013. 9:57 AM

## 2013-12-16 NOTE — Anesthesia Postprocedure Evaluation (Signed)
  Anesthesia Post-op Note  Patient: Hayley Herring  Procedure(s) Performed: Procedure(s): CESAREAN SECTION (N/A)  Patient Location: PACU  Anesthesia Type:Epidural  Level of Consciousness: awake, alert  and oriented  Airway and Oxygen Therapy: Patient Spontanous Breathing  Post-op Pain: none  Post-op Assessment: Post-op Vital signs reviewed, Patient's Cardiovascular Status Stable, Respiratory Function Stable, Patent Airway, No signs of Nausea or vomiting, Pain level controlled, No headache and No backache  Post-op Vital Signs: Reviewed and stable  Last Vitals:  Filed Vitals:   12/16/13 1230  BP: 113/67  Pulse: 87  Temp: 37.1 C  Resp: 27    Complications: No apparent anesthesia complications

## 2013-12-16 NOTE — Progress Notes (Signed)
Hayley Herring MRN: [id number]  Subjective: -Patient reports rectal pressure that increases with contractions.   Objective: BP 121/85  Pulse 78  Temp(Src) 99.5 F (37.5 C) (Axillary)  Resp 18  Ht 4\' 11"  (1.499 m)  Wt 179 lb (81.194 kg)  BMI 36.13 kg/m2  SpO2 97%   Total I/O In: -  Out: 690 [Urine:690] FHT: 150 bpm, Mod Var, +Early Decels, +Accels UC:   Moderate to strong Q3-20min, MVUs 200 SVE:   Dilation: Lip/rim Effacement (%): 90 Station: +2 Exam by:: Shalae Belmonte CNM Membranes:SROM x21 hrs Pitocin: 80mUn  Assessment:  IUP at 39.5wks Cat I FT  Active Labor-Progressive GBS Positive Afebrile  Plan: -Position change  -Will reassess in 1 hour or sooner -Prepare room for delivery -Patient instructed on breathing through contractions to reduce urge to push -Continue other mgmt as ordered  Sarita Hakanson LYNN,CNM, MSN 12/16/2013, 4:08 AM

## 2013-12-16 NOTE — Anesthesia Postprocedure Evaluation (Signed)
  Anesthesia Post-op Note  Patient: Hayley Herring  Procedure(s) Performed: Procedure(s): CESAREAN SECTION (N/A)  Patient Location: Mother/Baby  Anesthesia Type:Epidural  Level of Consciousness: awake, alert , oriented and patient cooperative  Airway and Oxygen Therapy: Patient Spontanous Breathing  Post-op Pain: mild  Post-op Assessment: Post-op Vital signs reviewed, Patient's Cardiovascular Status Stable, Respiratory Function Stable, Patent Airway, No headache, No backache, No residual numbness and No residual motor weakness  Post-op Vital Signs: Reviewed and stable  Last Vitals:  Filed Vitals:   12/16/13 1300  BP: 115/62  Pulse: 70  Temp: 37.1 C  Resp: 18    Complications: No apparent anesthesia complications

## 2013-12-16 NOTE — Progress Notes (Signed)
A cesarean section is recommended for failure to descend. Risks and benefits were reviewed. Questions answered. The pt and family are ready to proceed.  Dr. Raphael Gibney

## 2013-12-16 NOTE — Progress Notes (Signed)
Hayley Herring MRN: [id number]  Subjective: -Patient reports no change in rectal pressure.  Objective: BP 125/65  Pulse 93  Temp(Src) 99.8 F (37.7 C) (Axillary)  Resp 18  Ht 4\' 11"  (1.499 m)  Wt 179 lb (81.194 kg)  BMI 36.13 kg/m2  SpO2 97%   Total I/O In: -  Out: 690 [Urine:690] FHT: 155 bpm, Mod Var, + Early, Variable Decels, +Accels UC:  Q3-89min, palpates moderate, MVUs 180  SVE:   Dilation: Lip/rim Effacement (%): 90 Station: 0 Exam by:: Aylana Hirschfeld cnm Membranes: SROM x 23hrs Pitocin: 20mUn/min  Assessment:  IUP at 39.5wks Cat II FT GBS Positive Arrest of Dilation Persistent Anterior Lip b-Thalassemia  Plan: -Position change resolves Cat II FT to Cat I FT -Discussed recommendation for primary c/s due to arrest of dilation -Risk & benefits discussed including, but not limited to, bleeding, infection, damage to organs and/or infant, and need for further surgery including, but not limited to, hysterectomy.  Patient verbalized understanding---No questions, concerns addressed. -Discussed current Hgb of 9.9 and potential for blood transfusion--Patient accepts blood transfusion in emergency situation. -Patient desires and requests more time in hope of complete dilation and vaginal delivery. Reassurances given and patient informed that this request will be honored as long as she remains afebrile and infant is without s/s of distress.  Patient verbalizes understanding. -Dr. AVS updated on patient status and agrees with POC as above.  Advised that patient be informed that it is our recommendation that we allow 1-1.5 hours for cervical change in the absence of infection and/or fetal distress.  Patient informed of recommendation and agrees. -Will report to V. Standard, CNM -Continue other mgmt as ordered  Hayley Herring,CNM, MSN 12/16/2013, 6:41 AM

## 2013-12-16 NOTE — Transfer of Care (Signed)
Immediate Anesthesia Transfer of Care Note  Patient: Hayley Herring  Procedure(s) Performed: Procedure(s): CESAREAN SECTION (N/A)  Patient Location: PACU  Anesthesia Type:Epidural  Level of Consciousness: awake  Airway & Oxygen Therapy: Patient Spontanous Breathing  Post-op Assessment: Report given to PACU RN  Post vital signs: Reviewed and stable  Complications: No apparent anesthesia complications

## 2013-12-16 NOTE — Progress Notes (Signed)
Labor Progress  Subjective: Pushing began approximately 8:15, pt report being exhausted.  FOB unable to participate  Objective: BP 123/73  Pulse 101  Temp(Src) 98.7 F (37.1 C) (Oral)  Resp 20  Ht 4\' 11"  (1.499 m)  Wt 81.194 kg (179 lb)  BMI 36.13 kg/m2  SpO2 97% I/O last 3 completed shifts: In: -  Out: 690 [Urine:690]   FHT: 155, minimum variability, no accel, early decels CTX:  irregular, every 3-6 minutes Uterus gravid, soft non tender SVE:  Dilation: 10 Effacement (%): 100 Station: +2 Exam by:: Paul Trettin,CNM Pitocin at 81mUn/min  Assessment:  IUP at 39.5 weeks NICHD: Category 2 Membranes:  SROM x 26hrs, no s/s of infection Labor progress: ineffective pushing MVUs 165 Pitocin Augmentation GBS: positive  Plan: Continue labor plan Continuous monitoring Rest Ambulate Frequent position changes to facilitate fetal rotation and descent. Will reassess at 9:30 Possible CS  Continue pitocin per protocol Amnioinfusion with bolus of 300 cc, then 150 cc/hr.     Isadore Palecek, CNM, MSN 12/16/2013. 9:30 AM

## 2013-12-16 NOTE — Plan of Care (Signed)
Problem: Phase I Progression Outcomes Goal: Pain controlled with appropriate interventions Outcome: Progressing Epidural PCA given to patient to better control pain

## 2013-12-16 NOTE — Addendum Note (Signed)
Addendum created 12/16/13 1340 by Raenette Rover, CRNA   Modules edited: Notes Section   Notes Section:  File: [id number]

## 2013-12-16 NOTE — Progress Notes (Signed)
Hayley Herring MRN: [id number]  Subjective: -Patient now reporting some back pain.  [place] labs completed for elevated bp.  Patient denies visual disturbances, epigastric pain, numbness/tingling, or HA.  Patient also with axillary temp of 100.4.  Objective: BP 136/74  Pulse 72  Temp(Src) 99.7 F (37.6 C) (Axillary)  Resp 18  Ht 4\' 11"  (1.499 m)  Wt 179 lb (81.194 kg)  BMI 36.13 kg/m2  SpO2 97%   Total I/O In: -  Out: 690 [Urine:690] Results for orders placed during the hospital encounter of 12/15/13 (from the past 24 hour(s))  AMNISURE RUPTURE OF MEMBRANE (ROM)     Status: None   Collection Time    12/15/13 10:13 AM      Result Value Ref Range   Amnisure ROM POSITIVE    TYPE AND SCREEN     Status: None   Collection Time    12/15/13 11:00 AM      Result Value Ref Range   ABO/RH(D) AB POS     Antibody Screen NEG     Sample Expiration 12/18/2013    HIV ANTIBODY (ROUTINE TESTING)     Status: None   Collection Time    12/15/13 11:00 AM      Result Value Ref Range   HIV 1&2 Ab, 4th Generation NONREACTIVE  NONREACTIVE  CBC     Status: Abnormal   Collection Time    12/15/13 11:00 AM      Result Value Ref Range   WBC 15.5 (*) 4.0 - 10.5 K/uL   RBC 4.87  3.87 - 5.11 MIL/uL   Hemoglobin 9.9 (*) 12.0 - 15.0 g/dL   HCT 30.2 (*) 36.0 - 46.0 %   MCV 62.0 (*) 78.0 - 100.0 fL   MCH 20.3 (*) 26.0 - 34.0 pg   MCHC 32.8  30.0 - 36.0 g/dL   RDW 16.4 (*) 11.5 - 15.5 %   Platelets 287  150 - 400 K/uL  RPR     Status: None   Collection Time    12/15/13 11:00 AM      Result Value Ref Range   RPR NON REAC  NON REAC  ABO/RH     Status: None   Collection Time    12/15/13 11:00 AM      Result Value Ref Range   ABO/RH(D) AB POS    COMPREHENSIVE METABOLIC PANEL     Status: Abnormal   Collection Time    12/16/13 12:19 AM      Result Value Ref Range   Sodium 138  137 - 147 mEq/L   Potassium 3.9  3.7 - 5.3 mEq/L   Chloride 104  96 - 112 mEq/L   CO2 20  19 - 32 mEq/L   Glucose, Bld 104  (*) 70 - 99 mg/dL   BUN 6  6 - 23 mg/dL   Creatinine, Ser 0.73  0.50 - 1.10 mg/dL   Calcium 8.7  8.4 - 10.5 mg/dL   Total Protein 6.6  6.0 - 8.3 g/dL   Albumin 2.5 (*) 3.5 - 5.2 g/dL   AST 19  0 - 37 U/L   ALT 10  0 - 35 U/L   Alkaline Phosphatase 148 (*) 39 - 117 U/L   Total Bilirubin 0.5  0.3 - 1.2 mg/dL   GFR calc non Af Amer >90  >90 mL/min   GFR calc Af Amer >90  >90 mL/min   Anion gap 14  5 - 15  URIC ACID  Status: None   Collection Time    12/16/13 12:19 AM      Result Value Ref Range   Uric Acid, Serum 5.0  2.4 - 7.0 mg/dL  LACTATE DEHYDROGENASE     Status: None   Collection Time    12/16/13 12:19 AM      Result Value Ref Range   LDH 175  94 - 250 U/L  PROTEIN / CREATININE RATIO, URINE     Status: Abnormal   Collection Time    12/16/13 12:28 AM      Result Value Ref Range   Creatinine, Urine 64.32     Total Protein, Urine 346.5     Protein Creatinine Ratio 5.39 (*) 0.00 - 0.15    FHT:140 bpm, Mod Var, -Decels, +Accels UC:   Q2-18min, palpates mod. To strong, MVUs 235mmHg SVE:   Dilation: 8 Effacement (%): 90 Station: 0 Exam by:: Nate Perri cnm Positive Scalp Stimulation Membranes:SROM x 18hrs Pitocin: 57mUn/min IUPC in place Foley Catheter in place.   Assessment:  IUP at 39.5wks Cat I FT  Active Labor Elevated BP GBS Positive PC Ratio 5.39  Plan: -Discussed lab findings---all wnl except PC Ratio which was possibly elevated due to blood in urine -Tylenol EX given for temperature, will reassess in two hours -Continue position changes for fetal malpresentation and to promote fetal descent -Continue other mgmt as ordered -Will continue to observe  [facility], Efosa Treichler LYNN,CNM, MSN 12/16/2013, 1:41 AM

## 2013-12-16 NOTE — Progress Notes (Signed)
Hayley Herring MRN: [id number]  Subjective: -Patient reporting rectal pressure, but states it feels different; not as intense.    Objective: BP 106/78  Pulse 104  Temp(Src) 98.5 F (36.9 C) (Axillary)  Resp 18  Ht 4\' 11"  (1.499 m)  Wt 179 lb (81.194 kg)  BMI 36.13 kg/m2  SpO2 97%   Total I/O In: -  Out: 690 [Urine:690] FHT: 150 bpm, Mod Var, + Early & Variable Decels, +Accels UC:   Q3-41min, MVUs 195, palpates moderate SVE:   Dilation: Lip/rim Effacement (%): 90 Station: 0 Exam by:: Silus Lanzo cnm Bloody Show Noted Membranes: SROM x 22hrs Pitocin: 80mUn/min  Assessment:  IUP at 39.5wks Cat II FT  Active Labor-Persistent Anterior Lip GBS Positive  Plan: -Will reassess in 1 hour or prn  -Dr. Mahalia Longest updated on patient status -Continue other mgmt as ordered  [facility], Hayley Herring,CNM, MSN 12/16/2013, 5:24 AM

## 2013-12-16 NOTE — Op Note (Signed)
OPERATIVE NOTE  Patient's Name: Hayley Herring  Date of Birth: 07-27-1990   Medical Records Number: [id number]   Date of Operation: 12/16/2013   Preoperative diagnosis:  [redacted]w[redacted]d weeks gestation  primary cesarean section for failure to descend  Anemia  Overweight  Hidradenitis  Postoperative diagnosis:  [redacted]w[redacted]d weeks gestation  primary cesarean section for failure to descend  Anemia  Overweight  Hidradenitis  Procedure:  Low Transverse Cesarean Section  Surgeon:  Gildardo Cranker, M.D.  Assistant:  Venous Standard, certified nurse midwife  Anesthesia:  Epidural  Disposition:  [name] is a 23 y.o. female, gravida 1 para 0 who presents at [redacted]w[redacted]d weeks gestation. The patient has been followed at the [facility] division of [facility]. She was able to dilate her cervix to 10 cm. She was allowed to push. The fetal head did not descend. A cesarean section was recommended and accepted. She understands the indications for her procedure and she accepts the risk of, but not limited to, anesthetic complications, bleeding, infections, and possible damage to the surrounding organs.  Findings:  A 6 pound 13 ounce female (Zana) was delivered from a occiput posterior position.  The Apgar scores were 9/9. The uterus, fallopian tubes, and ovaries were normal for the gravid state.  Procedure:  The patient was taken to the operating room where it was determined that the epidural she had her labor would be adequate for cesarean delivery. The patient's abdomen was prepped with Chloraprep.  A Foley catheter was previously placed in the bladder. The patient was sterilely draped. The lower abdomen was injected with half percent Marcaine with epinephrine. A low transverse incision was made in the abdomen and carried sharply through the subcutaneous tissue, the fascia, and the anterior peritoneum. An incision was made in the  lower uterine segment. The incision was extended in a low transverse fashion. The membranes were ruptured. The fetal head was delivered without difficulty. The mouth and nose were suctioned. The remainder of the infant was then delivered. The cord was clamped and cut. The infant was handed to the awaiting pediatric team. The placenta was removed. The uterine cavity was cleaned of amniotic fluid, clotted blood, and membranes. The uterine incision was closed using a running locking suture of 2-0 Vicryl. An imbricating suture of 2-0 Vicryl was placed. The pelvis was vigorously irrigated. Hemostasis was adequate. The anterior peritoneum and the abdominal musculature were closed using 2-0 Vicryl. The fascia was closed using a running suture of 0 Vicryl followed by 3 interrupted sutures of 0 Vicryl. The subcutaneous layer was closed using interrupted sutures of 2-0 Vicryl. The skin was reapproximated using a subcuticular suture of 3-0 Monocryl. Sponge, needle, and instrument counts were correct on 2 occasions. The estimated blood loss for the procedure was 800 cc. The patient tolerated her procedure well. She was transported to the recovery room in stable condition. The infant was placed skin to skin. The placenta was sent to labor and delivery.  Gildardo Cranker, M.D.

## 2013-12-16 NOTE — Progress Notes (Signed)
Labor Progress  Subjective: Pt resting comfortable with epidural, FOB sleeping on the couch.    Objective: BP 123/73  Pulse 101  Temp(Src) 98.7 F (37.1 C) (Oral)  Resp 20  Ht 4\' 11"  (1.499 m)  Wt 81.194 kg (179 lb)  BMI 36.13 kg/m2  SpO2 97% I/O last 3 completed shifts: In: -  Out: 690 [Urine:690]   FHT: 150, minimum variability, early decels, occasional accel CTX:  regular, every 3-5 minutes Uterus gravid, soft non tender SVE:  Dilation: 10 Effacement (%): 100 Station: +2 Exam by:: Dawne Casali,CNM Pitocin at 50mUn/min  Assessment:  IUP at 39.5 weeks NICHD: Category 2 Membranes:  SROM x 24.5hrs, no s/s of infection Labor progress: Inadquate decent MVUs 210 Possible asynclitic position Pitocin Augmentation GBS: positive  Plan: Continue labor plan Continuous monitoring Rest Labor down in high folwers Sill start pushing in 1 hour Continue pitocin per protocol     Annetta Deiss, CNM, MSN 12/16/2013. 9:22 AM

## 2013-12-17 LAB — CBC
HCT: 21.9 % — ABNORMAL LOW (ref 36.0–46.0)
Hemoglobin: 7.2 g/dL — ABNORMAL LOW (ref 12.0–15.0)
MCH: 20.4 pg — ABNORMAL LOW (ref 26.0–34.0)
MCHC: 32.9 g/dL (ref 30.0–36.0)
MCV: 62 fL — ABNORMAL LOW (ref 78.0–100.0)
Platelets: 199 10*3/uL (ref 150–400)
RBC: 3.53 MIL/uL — ABNORMAL LOW (ref 3.87–5.11)
RDW: 16.3 % — AB (ref 11.5–15.5)
WBC: 16.5 10*3/uL — ABNORMAL HIGH (ref 4.0–10.5)

## 2013-12-17 MED ORDER — FERROUS SULFATE 325 (65 FE) MG PO TABS
325.0000 mg | ORAL_TABLET | Freq: Two times a day (BID) | ORAL | Status: DC
  Administered 2013-12-17 – 2013-12-19 (×4): 325 mg via ORAL
  Filled 2013-12-17 (×4): qty 1

## 2013-12-17 NOTE — Progress Notes (Addendum)
PROGRESS NOTE  I have reviewed the patient's vital signs, labs, and notes. I have examined the patient. I agree with the previous note from the Certified Nurse Midwife.  Transfusion discussed. Risks and benefits reviewed. Questions answered. Declined for now.  Gildardo Cranker, M.D. 12/17/2013

## 2013-12-17 NOTE — Progress Notes (Signed)
Subjective: Postpartum Day 1: Cesarean Delivery due to FTD Patient up ad lib, reports no syncope or dizziness. Feeding:  Breast Contraceptive plan:  Undecided  Objective: Vital signs in last 24 hours: Temp:  [97.9 F (36.6 C)-98.9 F (37.2 C)] 97.9 F (36.6 C) (10/31 0918) Pulse Rate:  [68-120] 85 (10/31 0935) Resp:  [16-27] 18 (10/31 0935) BP: (85-127)/(30-69) 104/57 mmHg (10/31 0935) SpO2:  [95 %-100 %] 95 % (10/31 0918)  Orthostatics stable.  Physical Exam:  General: alert Lochia: appropriate Uterine Fundus: firm Abdomen:  + bowel sounds, no distension Incision: Pressure dressing CDI DVT Evaluation: No evidence of DVT seen on physical exam. Negative Homan's sign.   Recent Labs  12/15/13 1100 12/17/13 0620  HGB 9.9* 7.2*  HCT 30.2* 21.9*  WBC 15.5* 16.5*    Assessment/Plan: Status post Cesarean section day 1. 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  Continue current care. Repeat CBC tomorrow Fe BID    Pauletta Pickney CNM, [place] 12/17/2013, 10:05 AM

## 2013-12-17 NOTE — Lactation Note (Addendum)
This note was copied from the chart of Hayley Herring. Lactation Consultation Note  Mother nipples are flat.  Provided her with shells and a hand pump. Suggest she prepump and  Hand express to help evert nipple. Baby having difficulty sustaining latch. Baby has disorganized suck and is tongue thrusting. Reviewed with mother how to compress her breast to achieve a deeper latch but baby pushes breast out of mouth.  Reviewed suck training. Attempted latching in both cross cradle, biological nursing and football position but she would maintain latch. Introduced #20NS.  Baby latched in football position.  Colostrum view in NS. Sucks and swallows observed with massage.  Mother needs some help with positioning. Encouraged mother to undress baby for feeding and do STS. Suggest she call for further instruction and mother should try bf without NS with any feeding.  Patient Name: Hayley Arnola Crittendon [id number] Date: 12/17/2013 Reason for consult: Follow-up assessment   Maternal Data    Feeding Feeding Type: Breast Fed  LATCH Score/Interventions Latch: Repeated attempts needed to sustain latch, nipple held in mouth throughout feeding, stimulation needed to elicit sucking reflex. Intervention(s): Skin to skin;Teach feeding cues;Waking techniques Intervention(s): Adjust position;Assist with latch;Breast massage  Audible Swallowing: A few with stimulation  Type of Nipple: Everted at rest and after stimulation Intervention(s): Hand pump;Shells  Comfort (Breast/Nipple): Soft / non-tender     Hold (Positioning): Assistance needed to correctly position infant at breast and maintain latch.  LATCH Score: 7  Lactation Tools Discussed/Used Tools: Nipple [place] Fuel;Pump Nipple shield size: 20   Consult Status Consult Status: Follow-up Date: 12/18/13 Follow-up type: In-patient    Vivianne Master [facility] 12/17/2013, 10:16 PM

## 2013-12-17 NOTE — Lactation Note (Signed)
This note was copied from the chart of Hayley Ileta Ofarrell. Lactation Consultation Note; Initial visit with mom. She is trying to latch baby when I went in. Mom reports this is the best feeding so far- she has been so sleepy. Baby now 51 hours old.. Mom with flat nipples but compressible areola. Baby doing some tongue thrusting. Encouraged mom to let her suck on her finger for some suck training. Mom asking if she has enough milk for baby now Mom easily able to hand express Colostrum. Pleased to see Colostrum. Encouraged skin to skin and reviewed feeding cues and encouraged to feed whenever she sees them. Bf brochure given with resources for support after DC. Mom aksing about formula- reviewed supply and demand. No further questions at present. To call for assist prn.   Patient Name: Hayley Herring [id number] Date: 12/17/2013 Reason for consult: Initial assessment   Maternal Data Formula Feeding for Exclusion: No Has patient been taught Hand Expression?: Yes Does the patient have breastfeeding experience prior to this delivery?: No  Feeding Feeding Type: Breast Fed Length of feed: 12 min  LATCH Score/Interventions Latch: Repeated attempts needed to sustain latch, nipple held in mouth throughout feeding, stimulation needed to elicit sucking reflex. Intervention(s): Skin to skin  Audible Swallowing: None  Type of Nipple: Flat  Comfort (Breast/Nipple): Soft / non-tender     Hold (Positioning): Assistance needed to correctly position infant at breast and maintain latch.  LATCH Score: 5  Lactation Tools Discussed/Used     Consult Status Consult Status: Follow-up Date: 12/17/13 Follow-up type: In-patient    Truddie Crumble 12/17/2013, 11:02 AM

## 2013-12-18 LAB — CBC
HCT: 21.5 % — ABNORMAL LOW (ref 36.0–46.0)
Hemoglobin: 7.2 g/dL — ABNORMAL LOW (ref 12.0–15.0)
MCH: 20.7 pg — ABNORMAL LOW (ref 26.0–34.0)
MCHC: 33.5 g/dL (ref 30.0–36.0)
MCV: 62 fL — ABNORMAL LOW (ref 78.0–100.0)
PLATELETS: 214 10*3/uL (ref 150–400)
RBC: 3.47 MIL/uL — AB (ref 3.87–5.11)
RDW: 16.4 % — ABNORMAL HIGH (ref 11.5–15.5)
WBC: 14.9 10*3/uL — AB (ref 4.0–10.5)

## 2013-12-18 NOTE — Progress Notes (Signed)
Came by to see patient--she is struggling some with breastfeeding (LCs working with her) and has not yet taken a shower. Will plan d/c tomorrow.  Donnel Saxon, CNM 12/18/13 3p

## 2013-12-18 NOTE — Plan of Care (Signed)
Problem: Phase II Progression Outcomes Goal: Pain controlled on oral analgesia Outcome: Completed/Met Date Met:  12/18/13 Goal: Progress activity as tolerated unless otherwise ordered Outcome: Completed/Met Date Met:  12/18/13 Goal: Afebrile, VS remain stable Outcome: Completed/Met Date Met:  12/18/13  Problem: Discharge Progression Outcomes Goal: Tolerating diet Outcome: Completed/Met Date Met:  12/18/13

## 2013-12-18 NOTE — Plan of Care (Signed)
Problem: Phase II Progression Outcomes Goal: Incision intact & without signs/symptoms of infection Outcome: Completed/Met Date Met:  12/18/13 Goal: Other Phase II Outcomes/Goals Outcome: Completed/Met Date Met:  12/18/13  Problem: Discharge Progression Outcomes Goal: Afebrile, VS remain stable at discharge Outcome: Completed/Met Date Met:  12/18/13

## 2013-12-18 NOTE — Lactation Note (Signed)
This note was copied from the chart of Hayley Nohemi Nicklaus. Lactation Consultation Note Follow up visit,  Mom is using a nipple shield due to flat nipples and baby not able to latch well.  Baby has 6 recorded feedings in the past 24 hours with 3 voids and 3 stools.   Baby has a 7%weight loss.  When I enter the room mom is holding baby to breast and not able to visualize nose, encouraged mom to hold baby so she can see bridge of nose.  Baby was not latched just laying close to mom.  She reports baby finished a 10 minutes feeding and has been sleepy. Demonstrated how to wake baby with taking clothes off to prepare for STS.  Demonstrated proper application of nipple shield mom was only placing on skin, but not allowing suction to pull nipple into shield.  Mom does report seeing colostrum with feedings. Mom uses a hand pump to help evert nipples some, but has not been doing hand expression.  Mom needs a lot of encouragement and assistance with positioning including hand placement on baby. Baby maintained latch for about 10 minutes and no colostrum was visible in nipple shield.  Mom is asking about formula and it may need to be considered for supplementation soon.  We were able to hand express 61mls of colostrum easily from mom.  Some was given with a syringe in the nipple shield On right breast, and then we attempted to place syringe in corner of baby's mouth, but baby would not maintain latch on shield with this feeding method.  Baby appears to have loose suction on the breast, and Even after 20 minutes on the left breast colostrum was easily expressed,  I"m considering how well baby is able to transfer milk.   Baby did take the remainder of EBM from a foley cup feeding and did well.  Mom reports baby looks calm and is pleased baby ate well.  Encouraged mom to continue hand expression and consider waking baby every 2 hours for feedings tonight.  Mom to supplement EBM and offered similac formula if needed.  Feeding  guidelines given and copy left in room.  Report given to [facility] RN.  Night LC to evaluate supplementation and weight loss tonight to consider more aggressive plan.    Patient Name: Hayley Herring HALPF'X Date: 12/18/2013 Reason for consult: Follow-up assessment;Difficult latch   Maternal Data Has patient been taught Hand Expression?: Yes Does the patient have breastfeeding experience prior to this delivery?: No  Feeding Feeding Type: Breast Fed Length of feed: 10 min  LATCH Score/Interventions Latch: Repeated attempts needed to sustain latch, nipple held in mouth throughout feeding, stimulation needed to elicit sucking reflex. Intervention(s): Skin to skin;Teach feeding cues;Waking techniques Intervention(s): Breast compression  Audible Swallowing: A few with stimulation Intervention(s): Skin to skin;Hand expression  Type of Nipple: Flat Intervention(s): Hand pump  Comfort (Breast/Nipple): Soft / non-tender  Problem noted: Mild/Moderate discomfort  Hold (Positioning): Assistance needed to correctly position infant at breast and maintain latch. Intervention(s): Skin to skin;Position options;Support Pillows;Breastfeeding basics reviewed  LATCH Score: 6  Lactation Tools Discussed/Used Tools: Nipple Shields Breast pump type: Manual   Consult Status Consult Status: Follow-up Date: 12/19/13 Follow-up type: In-patient    [place], Justine Null 12/18/2013, 7:12 PM

## 2013-12-18 NOTE — Progress Notes (Addendum)
Subjective:  Postpartum Day 2: Cesarean Delivery  Patient reports tolerating PO.    Objective: Vital signs in last 24 hours: Temp:  [97.9 F (36.6 C)-98.9 F (37.2 C)] 98 F (36.7 C) (11/01 0554) Pulse Rate:  [65-100] 65 (11/01 0554) Resp:  [18] 18 (11/01 0554) BP: (99-134)/(47-71) 134/67 mmHg (11/01 0554) SpO2:  [95 %] 95 % (10/31 6269)  Physical Exam:  General: no distress Lochia: appropriate Uterine Fundus: firm Incision: Dressing clean and dry DVT Evaluation: No evidence of DVT seen on physical exam.   Recent Labs  12/17/13 0620 12/18/13 0600  HGB 7.2* 7.2*  HCT 21.9* 21.5*    Assessment/Plan: Status post Cesarean section. Doing well postoperatively.  The pt is undecided about discharge today. We will reassess this afternoon.  Lorren Splawn V 12/18/2013, 8:29 AM

## 2013-12-19 ENCOUNTER — Encounter (HOSPITAL_COMMUNITY): Payer: Self-pay | Admitting: Obstetrics and Gynecology

## 2013-12-19 ENCOUNTER — Ambulatory Visit: Payer: Self-pay

## 2013-12-19 MED ORDER — IBUPROFEN 600 MG PO TABS: 600.0000 mg | ORAL_TABLET | Freq: Four times a day (QID) | ORAL | Status: DC

## 2013-12-19 MED ORDER — OXYCODONE-ACETAMINOPHEN 5-325 MG PO TABS: 1.0000 | ORAL_TABLET | ORAL | Status: DC | PRN

## 2013-12-19 NOTE — Lactation Note (Signed)
This note was copied from the chart of Hayley Julianny Milstein. Lactation Consultation Note Baby has had 10% wt. Loss. Has had 7 voids and 12 poops since birth at 14 hrs. Of age. Mom has flat nipples but very compressible, cant tolerate baby latched onto nipple w/o NS. Easily flowing transitioning colostrum. Lt. Nipple has raw tissue hanging from nipple. Comfort gels given. Using #20 NS. Baby is suckling on NS, repositioned baby, mom had baby laying by her side w/baby's head looking upwards w/body twisted. Repositioned and discussed proper body alignment. Not noticing milk transfer in NS?? Baby is having adequate output. But weight loss. Hand expressed 6 ml. And gave in curve tip syring during feeding to breast. Encouraged breast massage during feeding. Stressed importance of feeding and output documentation. Encouraged mom to look for milk in NS after feedings. Will cont. To monitor. Patient Name: Hayley Herring Date: 12/19/2013 Reason for consult: Follow-up assessment;Infant weight loss   Maternal Data    Feeding Feeding Type: Breast Fed Length of feed: 10 min  LATCH Score/Interventions Latch: Grasps breast easily, tongue down, lips flanged, rhythmical sucking. Intervention(s): Skin to skin;Teach feeding cues;Waking techniques Intervention(s): Assist with latch;Breast massage;Breast compression  Audible Swallowing: A few with stimulation Intervention(s): Skin to skin;Hand expression Intervention(s): Hand expression  Type of Nipple: Flat (compresses well) Intervention(s): Shells;Hand pump  Comfort (Breast/Nipple): Filling, red/small blisters or bruises, mild/mod discomfort Problem noted: Cracked, bleeding, blisters, bruises Intervention(s): Hand pump     Hold (Positioning): Assistance needed to correctly position infant at breast and maintain latch. Intervention(s): Breastfeeding basics reviewed;Support Pillows;Position options;Skin to skin  LATCH Score: 6  Lactation  Tools Discussed/Used Tools: Shells;Nipple Shields;Pump;Comfort gels Nipple shield size: 20 Shell Type: Inverted Breast pump type: Manual   Consult Status Consult Status: Follow-up Date: 12/19/13 Follow-up type: In-patient    Theodoro Kalata 12/19/2013, 4:54 AM

## 2013-12-19 NOTE — Discharge Summary (Signed)
Cesarean Section Delivery Discharge Summary  Hayley Herring  DOB:    05/06/1990 MRN:    [id number] CSN:    [id number]  Date of admission:                  Oct 29, 215  Date of discharge:                   Dec 19, 2013  Procedures this admission: Primary LTCS, by Dr. Mahalia Longest, due to Failure to Descend  Date of Delivery: Dec 16, 2013  Newborn Data:  Live born female  Birth Weight: 6 lb 13 oz (3090 g) APGAR: 9, 9  Home with mother. Name: Zana Circumcision Plan: N/A  History of Present Illness:  Ms. Hayley Herring is a 23 y.o. female, G1P1001, who presents at [redacted]w[redacted]d weeks gestation. The patient has been followed at the [facility] and Gynecology division of [facility].    Her pregnancy has been complicated by:  Patient Active Problem List   Diagnosis Date Noted  . Cesarean delivery delivered 12/16/2013  . Positive GBS test 12/15/2013  . Hydradenitis 12/15/2013  . Normal labor 12/15/2013  . Thalassemia trait, beta--Hgb 8.9 on 10/03/13 10/13/2013    Hospital Course--UnScheduled Cesarean: Patient was admitted on 12/15/2013 for SROM. Positive GBS, Utilized Epidural for pain management and labored to C/C/0.  Due to failure to descend she was consented for cesarean, with Dr. AVS performed a LTCS under Epidural/spinal anesthesia, with delivery of a viable Female, with weight and Apgars as listed above. Infant was in good condition and remained at the patient's bedside.  The patient was taken to recovery in good condition.  Patient planned to breastfeed, but was supplementing due to difficulties prior to discharge.  On post-op day 1, patient was doing well, tolerating a regular diet, with Hgb of 7.2.  Which was repeated on Day 2 and remained stable.  Patient with history of b-thalassemia. Throughout her stay, her physical exam was WNL, her incision was CDI, and her vital signs remained stable.  By post-op day 3, she was up ad lib, tolerating a regular diet, with  good pain control with po med.  She was deemed to have received the full benefit of her hospital stay, and was discharged home in stable condition.  Contraceptive choice was unknown and information provided.      Feeding: breast and bottle  Contraception:  Unsure  Discharge hemoglobin:  HEMOGLOBIN  Date Value Ref Range Status  12/18/2013 7.2* 12.0 - 15.0 g/dL Final  12/17/2013 7.2* 12.0 - 15.0 g/dL Final    Comment:    REPEATED TO VERIFY DELTA CHECK NOTED  12/15/2013 9.9* 12.0 - 15.0 g/dL Final   HCT  Date Value Ref Range Status  12/18/2013 21.5* 36.0 - 46.0 % Final  12/17/2013 21.9* 36.0 - 46.0 % Final  12/15/2013 30.2* 36.0 - 46.0 % Final   WBC  Date Value Ref Range Status  12/18/2013 14.9* 4.0 - 10.5 K/uL Final  12/17/2013 16.5* 4.0 - 10.5 K/uL Final  12/15/2013 15.5* 4.0 - 10.5 K/uL Final    Discharge Physical Exam:   General: alert, cooperative and no distress Lochia: appropriate Uterine Fundus: firm Abdomen:  + bowel sounds, No distention Incision: Honey comb dressing removed,  healing well, no significant drainage DVT Evaluation: No evidence of DVT seen on physical exam. Negative Homan's sign. No significant calf/ankle edema.  Intrapartum Procedures: cesarean: low cervical, transverse and GBS prophylaxis Postpartum Procedures: none Complications-Operative and  Postpartum: none  Discharge Diagnoses: Term Pregnancy-delivered  Discharge Information:  Activity:           pelvic rest Diet:                routine Medications: PNV, Ibuprofen, Colace, Iron and Percocet Condition:      stable Instructions:  Incision care guidelines: how to clean, when to call, and anticipated healing. PPD, Iron Supplementation, Follow up Care Discharge to: home  Follow-up Information    Follow up with [facility]. Schedule an appointment as soon as possible for a visit in 5 weeks.   Specialty:  Obstetrics and Gynecology   Why:  Please call if you  have any questions or concerns prior to your next visit.   Contact information:   [address] [place] [address] [phone]       Sedalia Muta CNM, MSN 12/19/2013 4:03 PM

## 2013-12-19 NOTE — Discharge Instructions (Signed)
Postpartum Care After Cesarean Delivery °After you deliver your newborn (postpartum period), the usual stay in the hospital is 24-72 hours. If there were problems with your labor or delivery, or if you have other medical problems, you might be in the hospital longer.  °While you are in the hospital, you will receive help and instructions on how to care for yourself and your newborn during the postpartum period.  °While you are in the hospital: °· It is normal for you to have pain or discomfort from the incision in your abdomen. Be sure to tell your nurses when you are having pain, where the pain is located, and what makes the pain worse. °· If you are breastfeeding, you may feel uncomfortable contractions of your uterus for a couple of weeks. This is normal. The contractions help your uterus get back to normal size. °· It is normal to have some bleeding after delivery. °¨ For the first 1-3 days after delivery, the flow is red and the amount may be similar to a period. °¨ It is common for the flow to start and stop. °¨ In the first few days, you may pass some small clots. Let your nurses know if you begin to pass large clots or your flow increases. °¨ Do not  flush blood clots down the toilet before having the nurse look at them. °¨ During the next 3-10 days after delivery, your flow should become more watery and pink or brown-tinged in color. °¨ Ten to fourteen days after delivery, your flow should be a small amount of yellowish-white discharge. °¨ The amount of your flow will decrease over the first few weeks after delivery. Your flow may stop in 6-8 weeks. Most women have had their flow stop by 12 weeks after delivery. °· You should change your sanitary pads frequently. °· Wash your hands thoroughly with soap and water for at least 20 seconds after changing pads, using the toilet, or before holding or feeding your newborn. °· Your intravenous (IV) tubing will be removed when you are drinking enough fluids. °· The  urine drainage tube (urinary catheter) that was inserted before delivery may be removed within 6-8 hours after delivery or when feeling returns to your legs. You should feel like you need to empty your bladder within the first 6-8 hours after the catheter has been removed. °· In case you become weak, lightheaded, or faint, call your nurse before you get out of bed for the first time and before you take a shower for the first time. °· Within the first few days after delivery, your breasts may begin to feel tender and full. This is called engorgement. Breast tenderness usually goes away within 48-72 hours after engorgement occurs. You may also notice milk leaking from your breasts. If you are not breastfeeding, do not stimulate your breasts. Breast stimulation can make your breasts produce more milk. °· Spending as much time as possible with your newborn is very important. During this time, you and your newborn can feel close and get to know each other. Having your newborn stay in your room (rooming in) will help to strengthen the bond with your newborn. It will give you time to get to know your newborn and become comfortable caring for your newborn. °· Your hormones change after delivery. Sometimes the hormone changes can temporarily cause you to feel sad or tearful. These feelings should not last more than a few days. If these feelings last longer than that, you should talk to your   caregiver.  If desired, talk to your caregiver about methods of family planning or contraception.  Talk to your caregiver about immunizations. Your caregiver may want you to have the following immunizations before leaving the hospital:  Tetanus, diphtheria, and pertussis (Tdap) or tetanus and diphtheria (Td) immunization. It is very important that you and your family (including grandparents) or others caring for your newborn are up-to-date with the Tdap or Td immunizations. The Tdap or Td immunization can help protect your newborn  from getting ill.  Rubella immunization.  Varicella (chickenpox) immunization.  Influenza immunization. You should receive this annual immunization if you did not receive the immunization during your pregnancy. Document Released: 10/29/2011 Document Reviewed: 10/29/2011 [facility] Patient Information 2015 [place]. This information is not intended to replace advice given to you by your health care provider. Make sure you discuss any questions you have with your health care provider. Contraception Choices Birth control (contraception) is the use of any methods or devices to stop pregnancy from happening. Below are some methods to help avoid pregnancy. HORMONAL BIRTH CONTROL  A small tube put under the skin of the upper arm (implant). The tube can stay in place for 3 years. The implant must be taken out after 3 years.  Shots given every 3 months.  Pills taken every day.  Patches that are changed once a week.  A ring put into the vagina (vaginal ring). The ring is left in place for 3 weeks and removed for 1 week. Then, a new ring is put in the vagina.  Emergency birth control pills taken after unprotected sex (intercourse). BARRIER BIRTH CONTROL   A thin covering worn on the penis (female condom) during sex.  A soft, loose covering put into the vagina (female condom) before sex.  A rubber bowl that sits over the cervix (diaphragm). The bowl must be made for you. The bowl is put into the vagina before sex. The bowl is left in place for 6 to 8 hours after sex.  A small, soft cup that fits over the cervix (cervical cap). The cup must be made for you. The cup can be left in place for 48 hours after sex.  A sponge that is put into the vagina before sex.  A chemical that kills or stops sperm from getting into the cervix and uterus (spermicide). The chemical may be a cream, jelly, foam, or pill. INTRAUTERINE (IUD) BIRTH CONTROL   IUD birth control is a small, T-shaped piece of  plastic. The plastic is put inside the uterus. There are 2 types of IUD:  Copper IUD. The IUD is covered in copper wire. The copper makes a fluid that kills sperm. It can stay in place for 10 years.  Hormone IUD. The hormone stops pregnancy from happening. It can stay in place for 5 years. PERMANENT METHODS  When the woman has her fallopian tubes sealed, tied, or blocked during surgery. This stops the egg from traveling to the uterus.  The doctor places a small coil or insert into each fallopian tube. This causes scar tissue to form and blocks the fallopian tubes.  When the female has the tubes that carry sperm tied off (vasectomy). NATURAL FAMILY PLANNING BIRTH CONTROL   Natural family planning means not having sex or using barrier birth control on the days the woman could become pregnant.  Use a calendar to keep track of the length of each period and know the days she can get pregnant.  Avoid sex during ovulation.  Use a thermometer to measure body temperature. Also watch for symptoms of ovulation.  Time sex to be after the woman has ovulated. Use condoms to help protect yourself against sexually transmitted infections (STIs). Do this no matter what type of birth control you use. Talk to your doctor about which type of birth control is best for you. Document Released: 12/01/2008 Document Revised: 02/08/2013 Document Reviewed: 08/25/2012 [facility] Patient Information 2015 [place], [place]. This information is not intended to replace advice given to you by your health care provider. Make sure you discuss any questions you have with your health care provider.

## 2013-12-19 NOTE — Lactation Note (Signed)
This note was copied from the chart of Hayley Herring. Lactation Consultation Note  Patient Name: Hayley Herring [id number] Date: 12/19/2013 Reason for consult: Follow-up assessment;Difficult latch Mom latched baby to right breast using #20 nipple shield. LC assisted Mom to obtain more support for better positioning and to obtain more depth with latch. Baby nursed for approx 5 minutes, Mom reports some discomfort. Baby came off the breast and small amount of blood noted. LC changed nipple shield to size 24. Mom denies discomfort, no bleeding noted with #24 nipple shield, audible swallows noted and lots of colostrum visible in the nipple shield. Advised Mom to use #24 nipple shield with latch, continue to observe for breast milk in nipple shield. Mom plans to use hand pump to post pump after feedings. Continue to supplement due to 10% weight loss till Mom see [facility] tomorrow. Advised Mom baby should be at the breast 8-12 times in 24 hours. If she does not put baby to breast, she needs to pump to encourage milk production and protect milk supply. Encourage to post pump after feedings 15 minutes each breast.  Previously discussed engorgement care if needed. Advised to call for questions/concerns.   Maternal Data    Feeding Feeding Type: Breast Fed Length of feed: 7 min  LATCH Score/Interventions Latch: Grasps breast easily, tongue down, lips flanged, rhythmical sucking. (using nipple shield) Intervention(s): Adjust position;Assist with latch  Audible Swallowing: Spontaneous and intermittent (when nipple shield size changed from 20 to 24)  Type of Nipple: Everted at rest and after stimulation  Comfort (Breast/Nipple): Filling, red/small blisters or bruises, mild/mod discomfort Intervention(s): Expressed breast milk to nipple  Problem noted: Mild/Moderate discomfort  Hold (Positioning): Assistance needed to correctly position infant at breast and maintain latch. Intervention(s): Breastfeeding  basics reviewed;Support Pillows;Position options;Skin to skin  LATCH Score: 8  Lactation Tools Discussed/Used Tools: Pump Nipple shield size: 20;24 Breast pump type: Manual   Consult Status Consult Status: Complete Date: 12/19/13 Follow-up type: In-patient    Katrine Coho 12/19/2013, 4:26 PM

## 2013-12-19 NOTE — Lactation Note (Signed)
This note was copied from the chart of Hayley Herring. Lactation Consultation Note  Patient Name: Hayley Kyung Muto [id number] Date: 12/19/2013 Reason for consult: Follow-up assessment LC returned to complete visit. FOB just finished giving baby bottle. Mom advised LC she has Lactation f/u with [facility] thru [facility] scheduled for tomorrow. Peds f/u Wednesday. Stressed to [employer] importance of BF with each feeding or pumping if baby does not go to the breast. Reviewed supply and demand, engorgement care. Encouraged Mom to call with next feeding before d/c for LC to observe latch since Mom is using nipple shield and LC unsure of milk transfer. At this time Mom plans to use hand pump.   Maternal Data    Feeding Feeding Type: Formula Length of feed: 15 min  LATCH Score/Interventions                      Lactation Tools Discussed/Used Tools: Pump Breast pump type: Manual WIC Program: No   Consult Status Consult Status: Follow-up Date: 12/19/13 Follow-up type: In-patient    Katrine Coho 12/19/2013, 12:06 PM

## 2013-12-19 NOTE — Progress Notes (Signed)
Ur chart review completed.  

## 2013-12-19 NOTE — Lactation Note (Signed)
This note was copied from the chart of Hayley Herring. Lactation Consultation Note  Patient Name: Hayley Herring [id number] Date: 12/19/2013 Reason for consult: Follow-up assessment Baby recently had bottle with formula, baby sleeping. Mom using nipple shield to latch and reports observing some breast milk in nipple shield. LC that have seen Mom with previous feeding have not observed any breast milk in the nipple shield. Mom has not been pumping but has manual pump. Discussed using [employer] for home, Mom is not registered with [facility]. LC gave Mom phone number to call for the [facility] office to schedule appointment. Discussed possible pump rental. [facility] will be going to [facility] f/u. LC encouraged OP f/u due to weight loss and using nipple shield to latch. Mom to call [facility] and schedule OP appointment. LC advised Mom she will return to observe next feeding.   Maternal Data    Feeding Feeding Type: Formula Length of feed: 15 min  LATCH Score/Interventions                      Lactation Tools Discussed/Used WIC Program: No   Consult Status Consult Status: Follow-up Date: 12/19/13 Follow-up type: In-patient    Katrine Coho 12/19/2013, 12:02 PM

## 2013-12-20 ENCOUNTER — Inpatient Hospital Stay (HOSPITAL_BASED_OUTPATIENT_CLINIC_OR_DEPARTMENT_OTHER)
Admission: EM | Admit: 2013-12-20 | Discharge: 2013-12-22 | DRG: 776 | Disposition: A | Discharge status: Home / Self Care | Payer: Medicaid Other | Attending: Obstetrics & Gynecology | Admitting: Obstetrics & Gynecology

## 2013-12-20 ENCOUNTER — Emergency Department (HOSPITAL_BASED_OUTPATIENT_CLINIC_OR_DEPARTMENT_OTHER): Payer: Medicaid Other

## 2013-12-20 ENCOUNTER — Encounter (HOSPITAL_BASED_OUTPATIENT_CLINIC_OR_DEPARTMENT_OTHER): Payer: Self-pay | Admitting: *Deleted

## 2013-12-20 DIAGNOSIS — Z3A39 39 weeks gestation of pregnancy: Secondary | ICD-10-CM | POA: Diagnosis present

## 2013-12-20 DIAGNOSIS — E663 Overweight: Secondary | ICD-10-CM | POA: Diagnosis present

## 2013-12-20 DIAGNOSIS — D569 Thalassemia, unspecified: Secondary | ICD-10-CM | POA: Diagnosis present

## 2013-12-20 DIAGNOSIS — O1490 Unspecified pre-eclampsia, unspecified trimester: Secondary | ICD-10-CM | POA: Diagnosis present

## 2013-12-20 DIAGNOSIS — D473 Essential (hemorrhagic) thrombocythemia: Secondary | ICD-10-CM | POA: Diagnosis present

## 2013-12-20 DIAGNOSIS — O1415 Severe pre-eclampsia, complicating the puerperium: Secondary | ICD-10-CM | POA: Diagnosis present

## 2013-12-20 DIAGNOSIS — Z6835 Body mass index (BMI) 35.0-35.9, adult: Secondary | ICD-10-CM | POA: Diagnosis not present

## 2013-12-20 DIAGNOSIS — R0602 Shortness of breath: Secondary | ICD-10-CM

## 2013-12-20 DIAGNOSIS — R6252 Short stature (child): Secondary | ICD-10-CM

## 2013-12-20 DIAGNOSIS — O152 Eclampsia in the puerperium: Principal | ICD-10-CM | POA: Diagnosis present

## 2013-12-20 DIAGNOSIS — D638 Anemia in other chronic diseases classified elsewhere: Secondary | ICD-10-CM | POA: Diagnosis present

## 2013-12-20 DIAGNOSIS — O165 Unspecified maternal hypertension, complicating the puerperium: Secondary | ICD-10-CM | POA: Diagnosis present

## 2013-12-20 DIAGNOSIS — J189 Pneumonia, unspecified organism: Secondary | ICD-10-CM

## 2013-12-20 DIAGNOSIS — L732 Hidradenitis suppurativa: Secondary | ICD-10-CM | POA: Diagnosis present

## 2013-12-20 LAB — PROTEIN / CREATININE RATIO, URINE
Creatinine, Urine: 33.58 mg/dL
PROTEIN CREATININE RATIO: 0.15 (ref 0.00–0.15)
TOTAL PROTEIN, URINE: 5.1 mg/dL

## 2013-12-20 LAB — CBC WITH DIFFERENTIAL/PLATELET
BASOS ABS: 0 10*3/uL (ref 0.0–0.1)
BASOS PCT: 0 % (ref 0–1)
EOS ABS: 0.2 10*3/uL (ref 0.0–0.7)
Eosinophils Relative: 1 % (ref 0–5)
HCT: 22.9 % — ABNORMAL LOW (ref 36.0–46.0)
Hemoglobin: 7.6 g/dL — ABNORMAL LOW (ref 12.0–15.0)
LYMPHS ABS: 3.4 10*3/uL (ref 0.7–4.0)
Lymphocytes Relative: 22 % (ref 12–46)
MCH: 20.5 pg — AB (ref 26.0–34.0)
MCHC: 33.2 g/dL (ref 30.0–36.0)
MCV: 61.7 fL — ABNORMAL LOW (ref 78.0–100.0)
MONO ABS: 0.6 10*3/uL (ref 0.1–1.0)
Monocytes Relative: 4 % (ref 3–12)
Neutro Abs: 11.4 10*3/uL — ABNORMAL HIGH (ref 1.7–7.7)
Neutrophils Relative %: 73 % (ref 43–77)
Platelets: 407 10*3/uL — ABNORMAL HIGH (ref 150–400)
RBC: 3.71 MIL/uL — ABNORMAL LOW (ref 3.87–5.11)
RDW: 16.1 % — AB (ref 11.5–15.5)
WBC: 15.6 10*3/uL — ABNORMAL HIGH (ref 4.0–10.5)
nRBC: 3 /100 WBC — ABNORMAL HIGH

## 2013-12-20 LAB — COMPREHENSIVE METABOLIC PANEL
ALK PHOS: 111 U/L (ref 39–117)
ALT: 18 U/L (ref 0–35)
AST: 23 U/L (ref 0–37)
Albumin: 2.5 g/dL — ABNORMAL LOW (ref 3.5–5.2)
Anion gap: 14 (ref 5–15)
BUN: 14 mg/dL (ref 6–23)
CO2: 21 mEq/L (ref 19–32)
Calcium: 8.9 mg/dL (ref 8.4–10.5)
Chloride: 108 mEq/L (ref 96–112)
Creatinine, Ser: 0.8 mg/dL (ref 0.50–1.10)
GFR calc Af Amer: >90 mL/min (ref >90)
GFR calc non Af Amer: >90 mL/min (ref >90)
Glucose, Bld: 92 mg/dL (ref 70–99)
POTASSIUM: 4.1 meq/L (ref 3.7–5.3)
Sodium: 143 mEq/L (ref 137–147)
TOTAL PROTEIN: 7.1 g/dL (ref 6.0–8.3)
Total Bilirubin: 0.3 mg/dL (ref 0.3–1.2)

## 2013-12-20 LAB — URINALYSIS, ROUTINE W REFLEX MICROSCOPIC
Bilirubin Urine: NEGATIVE
GLUCOSE, UA: NEGATIVE mg/dL
KETONES UR: NEGATIVE mg/dL
Nitrite: NEGATIVE
PH: 6 (ref 5.0–8.0)
Protein, ur: NEGATIVE mg/dL
Specific Gravity, Urine: 1.007 (ref 1.005–1.030)
Urobilinogen, UA: 0.2 mg/dL (ref 0.0–1.0)

## 2013-12-20 LAB — URIC ACID: Uric Acid, Serum: 6.5 mg/dL (ref 2.4–7.0)

## 2013-12-20 LAB — URINE MICROSCOPIC-ADD ON

## 2013-12-20 LAB — PRO B NATRIURETIC PEPTIDE: Pro B Natriuretic peptide (BNP): 2134 pg/mL — ABNORMAL HIGH (ref 0–125)

## 2013-12-20 LAB — TROPONIN I: Troponin I: <0.3 ng/mL (ref <0.30)

## 2013-12-20 MED ORDER — OXYTOCIN 40 UNITS IN LACTATED RINGERS INFUSION - SIMPLE MED: 62.5000 mL/h | INTRAVENOUS | Status: DC

## 2013-12-20 MED ORDER — OXYCODONE-ACETAMINOPHEN 5-325 MG PO TABS: 2.0000 | ORAL_TABLET | ORAL | Status: DC | PRN

## 2013-12-20 MED ORDER — LACTATED RINGERS IV SOLN: 500.0000 mL | INTRAVENOUS | Status: DC | PRN

## 2013-12-20 MED ORDER — FLEET ENEMA 7-19 GM/118ML RE ENEM: 1.0000 | ENEMA | RECTAL | Status: DC | PRN

## 2013-12-20 MED ORDER — CALCIUM CARBONATE ANTACID 500 MG PO CHEW: 2.0000 | CHEWABLE_TABLET | ORAL | Status: DC | PRN

## 2013-12-20 MED ORDER — PRENATAL MULTIVITAMIN CH
1.0000 | ORAL_TABLET | Freq: Every day | ORAL | Status: DC
  Administered 2013-12-21 – 2013-12-22 (×2): 1 via ORAL
  Filled 2013-12-20 (×2): qty 1

## 2013-12-20 MED ORDER — ACETAMINOPHEN 325 MG PO TABS: 650.0000 mg | ORAL_TABLET | ORAL | Status: DC | PRN

## 2013-12-20 MED ORDER — ZOLPIDEM TARTRATE 5 MG PO TABS: 5.0000 mg | ORAL_TABLET | Freq: Every evening | ORAL | Status: DC | PRN

## 2013-12-20 MED ORDER — IOHEXOL 350 MG/ML SOLN: 100.0000 mL | Freq: Once | INTRAVENOUS | Status: AC | PRN

## 2013-12-20 MED ORDER — FERROUS SULFATE 325 (65 FE) MG PO TABS
325.0000 mg | ORAL_TABLET | Freq: Two times a day (BID) | ORAL | Status: DC
  Administered 2013-12-21 – 2013-12-22 (×3): 325 mg via ORAL
  Filled 2013-12-20 (×3): qty 1

## 2013-12-20 MED ORDER — MAGNESIUM SULFATE BOLUS VIA INFUSION
4.0000 g | Freq: Once | INTRAVENOUS | Status: AC
  Administered 2013-12-20: 4 g via INTRAVENOUS
  Filled 2013-12-20: qty 500

## 2013-12-20 MED ORDER — IBUPROFEN 600 MG PO TABS
600.0000 mg | ORAL_TABLET | Freq: Four times a day (QID) | ORAL | Status: DC | PRN
Start: 2013-12-20 — End: 2013-12-22
  Administered 2013-12-21 – 2013-12-22 (×5): 600 mg via ORAL
  Filled 2013-12-20 (×5): qty 1

## 2013-12-20 MED ORDER — ONDANSETRON HCL 4 MG/2ML IJ SOLN: 4.0000 mg | Freq: Three times a day (TID) | INTRAMUSCULAR | Status: DC | PRN

## 2013-12-20 MED ORDER — COMPLETENATE 29-1 MG PO CHEW: 1.0000 | CHEWABLE_TABLET | Freq: Every day | ORAL | Status: DC

## 2013-12-20 MED ORDER — ONDANSETRON HCL 4 MG/2ML IJ SOLN: 4.0000 mg | Freq: Four times a day (QID) | INTRAMUSCULAR | Status: DC | PRN

## 2013-12-20 MED ORDER — LACTATED RINGERS IV SOLN
INTRAVENOUS | Status: DC
  Administered 2013-12-21: 09:00:00 via INTRAVENOUS

## 2013-12-20 MED ORDER — OXYCODONE-ACETAMINOPHEN 5-325 MG PO TABS
1.0000 | ORAL_TABLET | ORAL | Status: DC | PRN
Start: 2013-12-20 — End: 2013-12-20

## 2013-12-20 MED ORDER — OXYCODONE-ACETAMINOPHEN 5-325 MG PO TABS: 1.0000 | ORAL_TABLET | Freq: Four times a day (QID) | ORAL | Status: DC | PRN

## 2013-12-20 MED ORDER — LIDOCAINE HCL (PF) 1 % IJ SOLN: 30.0000 mL | INTRAMUSCULAR | Status: DC | PRN

## 2013-12-20 MED ORDER — MAGNESIUM SULFATE 40 G IN LACTATED RINGERS - SIMPLE
2.0000 g/h | INTRAVENOUS | Status: AC
Start: 2013-12-20 — End: 2013-12-21
  Filled 2013-12-20: qty 500

## 2013-12-20 MED ORDER — DOCUSATE SODIUM 100 MG PO CAPS
100.0000 mg | ORAL_CAPSULE | Freq: Every day | ORAL | Status: DC
  Administered 2013-12-21 – 2013-12-22 (×2): 100 mg via ORAL
  Filled 2013-12-20 (×2): qty 1

## 2013-12-20 MED ORDER — CITRIC ACID-SODIUM CITRATE 334-500 MG/5ML PO SOLN: 30.0000 mL | ORAL | Status: DC | PRN

## 2013-12-20 MED ORDER — LABETALOL HCL 5 MG/ML IV SOLN
20.0000 mg | INTRAVENOUS | Status: DC | PRN
  Administered 2013-12-20: 20 mg via INTRAVENOUS
  Filled 2013-12-20: qty 4

## 2013-12-20 MED ORDER — OXYTOCIN BOLUS FROM INFUSION: 500.0000 mL | INTRAVENOUS | Status: DC

## 2013-12-20 NOTE — H&P (Signed)
Hayley Herring is an 23 y.o. female, POD #3, CS for FTP by Dr Raphael Gibney presenting [facility] ER with c/o SOB w/o chest pain, see summary below.  Patient Active Problem List   Diagnosis Date Noted  . Preeclampsia 12/20/2013  . Cesarean delivery delivered 12/16/2013  . Positive GBS test 12/15/2013  . Hydradenitis 12/15/2013  . Normal labor 12/15/2013  . Thalassemia trait, beta--Hgb 8.9 on 10/03/13 10/13/2013    Pertinent Gynecological History: OB History: G1, P1 CS on 12/16/13 for FTD  Medical/Family/Social Hx: No LMP recorded.    Past Medical History  Diagnosis Date  . Beta thalassemia minor   . Hidradenitis suppurativa     Past Surgical History  Procedure Laterality Date  . Skin graft    . Cesarean section N/A 12/16/2013    Procedure: CESAREAN SECTION;  Surgeon: Ena Dawley, MD;  Location: [facility];  Service: Obstetrics;  Laterality: N/A;    History reviewed. No pertinent family history.  Social History:  reports that she has never smoked. She does not have any smokeless tobacco history on file. She reports that she does not drink alcohol or use illicit drugs.  Allergies/Meds:  Allergies: No Known Allergies   (Not in a hospital admission)   ROS See HPI above, all other systems are negative  Blood pressure 168/111, pulse 65, resp. rate 16, SpO2 97 %, unknown if currently breastfeeding. Physical Exam  Results for orders placed or performed during the hospital encounter of 12/20/13 (from the past 24 hour(s))  Comprehensive metabolic panel     Status: Abnormal   Collection Time: 12/20/13  3:32 PM  Result Value Ref Range   Sodium 143 137 - 147 mEq/L   Potassium 4.1 3.7 - 5.3 mEq/L   Chloride 108 96 - 112 mEq/L   CO2 21 19 - 32 mEq/L   Glucose, Bld 92 70 - 99 mg/dL   BUN 14 6 - 23 mg/dL   Creatinine, Ser 0.80 0.50 - 1.10 mg/dL   Calcium 8.9 8.4 - 10.5 mg/dL   Total Protein 7.1 6.0 - 8.3 g/dL   Albumin 2.5 (L) 3.5 - 5.2 g/dL   AST 23 0 - 37 U/L   ALT 18 0 -  35 U/L   Alkaline Phosphatase 111 39 - 117 U/L   Total Bilirubin 0.3 0.3 - 1.2 mg/dL   GFR calc non Af Amer >90 >90 mL/min   GFR calc Af Amer >90 >90 mL/min   Anion gap 14 5 - 15  CBC with Differential     Status: Abnormal   Collection Time: 12/20/13  3:32 PM  Result Value Ref Range   WBC 15.6 (H) 4.0 - 10.5 K/uL   RBC 3.71 (L) 3.87 - 5.11 MIL/uL   Hemoglobin 7.6 (L) 12.0 - 15.0 g/dL   HCT 22.9 (L) 36.0 - 46.0 %   MCV 61.7 (L) 78.0 - 100.0 fL   MCH 20.5 (L) 26.0 - 34.0 pg   MCHC 33.2 30.0 - 36.0 g/dL   RDW 16.1 (H) 11.5 - 15.5 %   Platelets 407 (H) 150 - 400 K/uL   Neutrophils Relative % 73 43 - 77 %   Lymphocytes Relative 22 12 - 46 %   Monocytes Relative 4 3 - 12 %   Eosinophils Relative 1 0 - 5 %   Basophils Relative 0 0 - 1 %   nRBC 3 (H) 0 /100 WBC   Neutro Abs 11.4 (H) 1.7 - 7.7 K/uL   Lymphs Abs 3.4 0.7 -  4.0 K/uL   Monocytes Absolute 0.6 0.1 - 1.0 K/uL   Eosinophils Absolute 0.2 0.0 - 0.7 K/uL   Basophils Absolute 0.0 0.0 - 0.1 K/uL   RBC Morphology TEARDROP CELLS    Smear Review PLATELET COUNT CONFIRMED BY SMEAR   Troponin I     Status: None   Collection Time: 12/20/13  3:32 PM  Result Value Ref Range   Troponin I <0.30 <0.30 ng/mL  Pro b natriuretic peptide (BNP)     Status: Abnormal   Collection Time: 12/20/13  3:32 PM  Result Value Ref Range   Pro B Natriuretic peptide (BNP) 2134.0 (H) 0 - 125 pg/mL  Uric acid     Status: None   Collection Time: 12/20/13  3:32 PM  Result Value Ref Range   Uric Acid, Serum 6.5 2.4 - 7.0 mg/dL  Urinalysis, Routine w reflex microscopic     Status: Abnormal   Collection Time: 12/20/13  3:57 PM  Result Value Ref Range   Color, Urine YELLOW YELLOW   APPearance CLEAR CLEAR   Specific Gravity, Urine 1.007 1.005 - 1.030   pH 6.0 5.0 - 8.0   Glucose, UA NEGATIVE NEGATIVE mg/dL   Hgb urine dipstick LARGE (A) NEGATIVE   Bilirubin Urine NEGATIVE NEGATIVE   Ketones, ur NEGATIVE NEGATIVE mg/dL   Protein, ur NEGATIVE NEGATIVE mg/dL    Urobilinogen, UA 0.2 0.0 - 1.0 mg/dL   Nitrite NEGATIVE NEGATIVE   Leukocytes, UA MODERATE (A) NEGATIVE  Urine microscopic-add on     Status: Abnormal   Collection Time: 12/20/13  3:57 PM  Result Value Ref Range   Squamous Epithelial / LPF RARE RARE   WBC, UA 7-10 <3 WBC/hpf   RBC / HPF 21-50 <3 RBC/hpf   Bacteria, UA FEW (A) RARE    Dg Chest 2 View  12/20/2013   CLINICAL DATA:  Shortness of breath.  EXAM: CHEST  2 VIEW  COMPARISON:  None.  FINDINGS: The heart size and mediastinal contours are within normal limits. No pneumothorax is noted. Minimal left pleural effusion may be present. Left posterior basilar opacity is noted concerning for pneumonia. Right lung appears clear. The visualized skeletal structures are unremarkable.  IMPRESSION: Probable left lower lobe pneumonia with associated minimal pleural effusion.   Electronically Signed   By: Sabino Dick M.D.   On: 12/20/2013 15:41    Physical Exam: deferred   Assessment: As of 1900 pt has not arrived to [facility], T/F no Physical or assessment is deferred to on coming provider  Plan: Mag Depending on PCR results Per Dr. Cletis Media admit to [facility] [address] Sign off to [place], MSN 12/20/2013. 5:12 PM

## 2013-12-20 NOTE — ED Provider Notes (Signed)
CSN: [id number]     Arrival date & time 12/20/13  1503 History   First MD Initiated Contact with Patient 12/20/13 1520     Chief Complaint  Patient presents with  . Shortness of Breath     (Consider location/radiation/quality/duration/timing/severity/associated sxs/prior Treatment) HPI Comments: Patient presents with shortness of breath at onset last night. It is worse with lying down and feels like she can't take a full breath. She underwent a C-section 3 days ago at [facility] by Dr. Raphael Gibney. This is her first pregnancy. No history of problems with this pregnancy. Baby was full-term. She denies any abdominal pain or vaginal bleeding. She denies any chest pain or back pain. She denies any cough or fever.  The history is provided by the patient and a relative.    Past Medical History  Diagnosis Date  . Beta thalassemia minor   . Hidradenitis suppurativa    Past Surgical History  Procedure Laterality Date  . Skin graft    . Cesarean section N/A 12/16/2013    Procedure: CESAREAN SECTION;  Surgeon: Ena Dawley, MD;  Location: [facility];  Service: Obstetrics;  Laterality: N/A;   History reviewed. No pertinent family history. History  Substance Use Topics  . Smoking status: Never Smoker   . Smokeless tobacco: Not on file  . Alcohol Use: No   OB History    Gravida Para Term Preterm AB TAB SAB Ectopic Multiple Living   1 1 1       1      Review of Systems  Constitutional: Negative for fever, activity change and appetite change.  HENT: Negative for congestion and rhinorrhea.   Respiratory: Positive for shortness of breath. Negative for cough and chest tightness.   Cardiovascular: Negative for chest pain.  Gastrointestinal: Negative for nausea, vomiting and abdominal pain.  Genitourinary: Negative for dysuria and hematuria.  Musculoskeletal: Positive for myalgias and arthralgias. Negative for back pain.  Skin: Negative for rash.  Neurological: Negative for dizziness,  weakness and headaches.  A complete 10 system review of systems was obtained and all systems are negative except as noted in the HPI and PMH.      Allergies  Review of patient's allergies indicates no known allergies.  Home Medications   Prior to Admission medications   Medication Sig Start Date End Date Taking? Authorizing Provider  ferrous sulfate 325 (65 FE) MG tablet Take 325 mg by mouth daily with breakfast.   Yes Historical Provider, MD  ibuprofen (ADVIL,MOTRIN) 600 MG tablet Take 1 tablet (600 mg total) by mouth every 6 (six) hours. 12/19/13  Yes Gavin Pound, CNM  Prenatal Vit-Fe Fumarate-FA (PRENATAL MULTIVITAMIN) TABS tablet Take 1 tablet by mouth daily at 12 noon.   Yes Historical Provider, MD  oxyCODONE-acetaminophen (PERCOCET/ROXICET) 5-325 MG per tablet Take 1 tablet by mouth every 4 (four) hours as needed (for pain scale less than 7). Patient not taking: Reported on 12/20/2013 12/19/13   Gavin Pound, CNM   BP 145/79 mmHg  Pulse 96  Temp(Src) 98.2 F (36.8 C) (Oral)  Resp 18  Ht 4\' 11"  (1.499 m)  Wt 174 lb 6.4 oz (79.107 kg)  BMI 35.21 kg/m2  SpO2 95% Physical Exam  Constitutional: She is oriented to person, place, and time. She appears well-developed and well-nourished. No distress.  HENT:  Head: Normocephalic and atraumatic.  Mouth/Throat: Oropharynx is clear and moist. No oropharyngeal exudate.  Eyes: Conjunctivae and EOM are normal. Pupils are equal, round, and reactive to light.  Neck: Normal  range of motion. Neck supple.  No meningismus.  Cardiovascular: Normal rate, regular rhythm, normal heart sounds and intact distal pulses.   No murmur heard. Pulmonary/Chest: Effort normal and breath sounds normal. No respiratory distress. She has no wheezes.  Abdominal: Soft. There is no tenderness. There is no rebound and no guarding.  Healing C section incision  Musculoskeletal: Normal range of motion. She exhibits edema. She exhibits no tenderness.  +2 pedal edema  bilaterally  Neurological: She is alert and oriented to person, place, and time. No cranial nerve deficit. She exhibits normal muscle tone. Coordination normal.  No ataxia on finger to nose bilaterally. No pronator drift. 5/5 strength throughout. CN 2-12 intact. Negative Romberg. Equal grip strength. Sensation intact. Gait is normal.   Skin: Skin is warm.  Psychiatric: She has a normal mood and affect. Her behavior is normal.  Nursing note and vitals reviewed.   ED Course  Procedures (including critical care time) Labs Review Labs Reviewed  COMPREHENSIVE METABOLIC PANEL - Abnormal; Notable for the following:    Albumin 2.5 (*)    All other components within normal limits  CBC WITH DIFFERENTIAL - Abnormal; Notable for the following:    WBC 15.6 (*)    RBC 3.71 (*)    Hemoglobin 7.6 (*)    HCT 22.9 (*)    MCV 61.7 (*)    MCH 20.5 (*)    RDW 16.1 (*)    Platelets 407 (*)    nRBC 3 (*)    Neutro Abs 11.4 (*)    All other components within normal limits  PRO B NATRIURETIC PEPTIDE - Abnormal; Notable for the following:    Pro B Natriuretic peptide (BNP) 2134.0 (*)    All other components within normal limits  URINALYSIS, ROUTINE W REFLEX MICROSCOPIC - Abnormal; Notable for the following:    Hgb urine dipstick LARGE (*)    Leukocytes, UA MODERATE (*)    All other components within normal limits  URINE MICROSCOPIC-ADD ON - Abnormal; Notable for the following:    Bacteria, UA FEW (*)    All other components within normal limits  MRSA PCR SCREENING  TROPONIN I  URIC ACID  PROTEIN / CREATININE RATIO, URINE  COMPREHENSIVE METABOLIC PANEL  LACTATE DEHYDROGENASE  URIC ACID  MAGNESIUM  CBC WITH DIFFERENTIAL    Imaging Review Dg Chest 2 View  12/20/2013   CLINICAL DATA:  Shortness of breath.  EXAM: CHEST  2 VIEW  COMPARISON:  None.  FINDINGS: The heart size and mediastinal contours are within normal limits. No pneumothorax is noted. Minimal left pleural effusion may be present. Left  posterior basilar opacity is noted concerning for pneumonia. Right lung appears clear. The visualized skeletal structures are unremarkable.  IMPRESSION: Probable left lower lobe pneumonia with associated minimal pleural effusion.   Electronically Signed   By: Sabino Dick M.D.   On: 12/20/2013 15:41     EKG Interpretation   Date/Time:  Tuesday December 20 2013 15:36:46 EST Ventricular Rate:  69 PR Interval:  126 QRS Duration: 74 QT Interval:  386 QTC Calculation: 413 R Axis:   75 Text Interpretation:  Normal sinus rhythm Normal ECG No previous ECGs  available Confirmed by Clarks [phone]) on 12/20/2013 3:46:49  PM      MDM   Final diagnoses:  Shortness of breath  Preeclampsia, unspecified trimester  shortness of breath with onset yesterday. 3 days after C-section. Patient with blood pressure 165/93. No blood pressure problems during pregnancy.  Elevated BNP with minimal pleural effusion on the left side. Patient with no cough or fever. Stable anemia.  EKG nsr.   Concern for preeclampsia.  BP 017C systolic.  No CP or headache.  Patient declines CT scan angiogram of her chest as she is breast-feeding.  Discussed with Dr. Cletis Media on call for Dr. Raphael Gibney. Concern for preeclampsia. She accepts patient to [facility]. She states not to start magnesium at this time. She states the hold off on antibiotics at this time. [facility]  No proteinuria on labs.  Dr. Cletis Media states not to start magnesium at this time.  Dr. Cletis Media also aware that patient refusing CT angiogram and will address possibility of PE at [facility].  Preeclampsia seems more likely source of patient SOB. IV labetalol as needed for hypertension.  CRITICAL CARE Performed by: Ezequiel Essex Total critical care time:40 Critical care time was exclusive of separately billable procedures and treating other patients. Critical care was necessary to treat or prevent imminent or  life-threatening deterioration. Critical care was time spent personally by me on the following activities: development of treatment plan with patient and/or surrogate as well as nursing, discussions with consultants, evaluation of patient's response to treatment, examination of patient, obtaining history from patient or surrogate, ordering and performing treatments and interventions, ordering and review of laboratory studies, ordering and review of radiographic studies, pulse oximetry and re-evaluation of patient's condition.   BP 145/79 mmHg  Pulse 96  Temp(Src) 98.2 F (36.8 C) (Oral)  Resp 18  Ht 4\' 11"  (1.499 m)  Wt 174 lb 6.4 oz (79.107 kg)  BMI 35.21 kg/m2  SpO2 95%    Ezequiel Essex, MD 12/21/13 [phone]

## 2013-12-20 NOTE — ED Notes (Signed)
[facility] here for transport

## 2013-12-20 NOTE — ED Notes (Signed)
Pt c/o SOB while lying down x 1 day c section x 3 days ago

## 2013-12-20 NOTE — ED Notes (Addendum)
[facility] 3rd floor unaware of pt-Spoke with Ardelle Lesches, house coverage at [facility] call back with bed assignment after speaking with accepting MD

## 2013-12-20 NOTE — Progress Notes (Addendum)
Hayley Herring, Hayley Herring FI#[id number]   Problem List Postpartum Preeclampsia (12/20/13) Probable left lower lobe pneumonia with associated minimal pleural Effusion (12/20/13) Beta Thalassemia Minor/Anemia (10/13/13) Reactive thrombocytosis (12/20/13) Overweight LTCS due to failure to descend - 12/16/13 GBS positive Hidradenitis Short stature   S: 23 yo [id number] s/p uncomplicated primary c-section for FTD on 12/16/13, presents to [facility] via EMS for admission for management of postpartum preeclampsia. Pt presented to [facility] today w/ c/o SOB x 1 day. Evaluation noteble for elevated BPs with normal labs. Dr. Cletis Media contacted and accepted pt for transfer. +flatus -BM  Upon presentation pt found to have elevated BPs. She received one dose of IV Labetalol w/ good results. SOB had resolved and decision was made to initiate Magnesium prophylaxis for dx of pp preeclampsia. Pt denies h/a, visual changes, CP, abdominal pain or N/V/D. She is tolerating a regular diet.  She is breastfeeding baby girl "Zana", who is doing well. Pt has had an o/w uneventful pp course.  O: BP range 151-178/76-98, pulse 64-75, RR 18, Temp 98.7. EMS reported manual BP of 220/120 during transport and 160s/110s prior to that (all manual readings.) Gen: NAD Lungs: clear to all fields, breathing unlabored CV: RRR Abdomen: soft, NTND, incision c/d/i Ext: 1+ pitting edema, 2+ DTRs, no clonus bilaterally Pelvic: Deferred Results for orders placed or performed during the hospital encounter of 12/20/13 (from the past 24 hour(s))  Comprehensive metabolic panel     Status: Abnormal   Collection Time: 12/20/13  3:32 PM  Result Value Ref Range   Sodium 143 137 - 147 mEq/L   Potassium 4.1 3.7 - 5.3 mEq/L   Chloride 108 96 - 112 mEq/L   CO2 21 19 - 32 mEq/L   Glucose, Bld 92 70 - 99 mg/dL   BUN 14 6 - 23 mg/dL   Creatinine, Ser 0.80 0.50 - 1.10 mg/dL   Calcium 8.9 8.4 - 10.5 mg/dL   Total Protein 7.1 6.0 - 8.3 g/dL   Albumin  2.5 (L) 3.5 - 5.2 g/dL   AST 23 0 - 37 U/L   ALT 18 0 - 35 U/L   Alkaline Phosphatase 111 39 - 117 U/L   Total Bilirubin 0.3 0.3 - 1.2 mg/dL   GFR calc non Af Amer >90 >90 mL/min   GFR calc Af Amer >90 >90 mL/min   Anion gap 14 5 - 15  CBC with Differential     Status: Abnormal   Collection Time: 12/20/13  3:32 PM  Result Value Ref Range   WBC 15.6 (H) 4.0 - 10.5 K/uL   RBC 3.71 (L) 3.87 - 5.11 MIL/uL   Hemoglobin 7.6 (L) 12.0 - 15.0 g/dL   HCT 22.9 (L) 36.0 - 46.0 %   MCV 61.7 (L) 78.0 - 100.0 fL   MCH 20.5 (L) 26.0 - 34.0 pg   MCHC 33.2 30.0 - 36.0 g/dL   RDW 16.1 (H) 11.5 - 15.5 %   Platelets 407 (H) 150 - 400 K/uL   Neutrophils Relative % 73 43 - 77 %   Lymphocytes Relative 22 12 - 46 %   Monocytes Relative 4 3 - 12 %   Eosinophils Relative 1 0 - 5 %   Basophils Relative 0 0 - 1 %   nRBC 3 (H) 0 /100 WBC   Neutro Abs 11.4 (H) 1.7 - 7.7 K/uL   Lymphs Abs 3.4 0.7 - 4.0 K/uL   Monocytes Absolute 0.6 0.1 - 1.0 K/uL   Eosinophils Absolute 0.2 0.0 -  0.7 K/uL   Basophils Absolute 0.0 0.0 - 0.1 K/uL   RBC Morphology TEARDROP CELLS    Smear Review PLATELET COUNT CONFIRMED BY SMEAR   Troponin I     Status: None   Collection Time: 12/20/13  3:32 PM  Result Value Ref Range   Troponin I <0.30 <0.30 ng/mL  Pro b natriuretic peptide (BNP)     Status: Abnormal   Collection Time: 12/20/13  3:32 PM  Result Value Ref Range   Pro B Natriuretic peptide (BNP) 2134.0 (H) 0 - 125 pg/mL  Uric acid     Status: None   Collection Time: 12/20/13  3:32 PM  Result Value Ref Range   Uric Acid, Serum 6.5 2.4 - 7.0 mg/dL  Urinalysis, Routine w reflex microscopic     Status: Abnormal   Collection Time: 12/20/13  3:57 PM  Result Value Ref Range   Color, Urine YELLOW YELLOW   APPearance CLEAR CLEAR   Specific Gravity, Urine 1.007 1.005 - 1.030   pH 6.0 5.0 - 8.0   Glucose, UA NEGATIVE NEGATIVE mg/dL   Hgb urine dipstick LARGE (A) NEGATIVE   Bilirubin Urine NEGATIVE NEGATIVE   Ketones, ur  NEGATIVE NEGATIVE mg/dL   Protein, ur NEGATIVE NEGATIVE mg/dL   Urobilinogen, UA 0.2 0.0 - 1.0 mg/dL   Nitrite NEGATIVE NEGATIVE   Leukocytes, UA MODERATE (A) NEGATIVE  Urine microscopic-add on     Status: Abnormal   Collection Time: 12/20/13  3:57 PM  Result Value Ref Range   Squamous Epithelial / LPF RARE RARE   WBC, UA 7-10 <3 WBC/hpf   RBC / HPF 21-50 <3 RBC/hpf   Bacteria, UA FEW (A) RARE  Protein / creatinine ratio, urine     Status: None   Collection Time: 12/20/13  3:58 PM  Result Value Ref Range   Creatinine, Urine 33.58 mg/dL   Total Protein, Urine 5.1 mg/dL   Protein Creatinine Ratio 0.15 0.00 - 0.15   CLINICAL DATA: Shortness of breath.  EXAM: CHEST 2 VIEW  COMPARISON: None.  FINDINGS: The heart size and mediastinal contours are within normal limits. No pneumothorax is noted. Minimal left pleural effusion may be present. Left posterior basilar opacity is noted concerning for pneumonia. Right lung appears clear. The visualized skeletal structures are unremarkable.  IMPRESSION: Probable left lower lobe pneumonia with associated minimal pleural effusion.   Electronically Signed  By: Sabino Dick M.D.  On: 12/20/2013 15:41  A: Postpartum preeclampsia Beta Thalassemia Minor/Anemia - on supplemental Iron Reactive thrombocytosis Adequate response to IV Labetalol  P: Continue Magnesium and supportive care per plan discussed w/ Dr. Alesia Richards. Antihypertensives prn. MRSA screen. Repeat preeclampsia labs in a.m.; obtain Magnesium level as well. Consult prn.   Farrel Gordon, CNM 12/20/13, 9:43 PM

## 2013-12-21 ENCOUNTER — Inpatient Hospital Stay (HOSPITAL_COMMUNITY): Payer: Medicaid Other

## 2013-12-21 LAB — CBC WITH DIFFERENTIAL/PLATELET
Basophils Absolute: 0 10*3/uL (ref 0.0–0.1)
Basophils Relative: 0 % (ref 0–1)
EOS PCT: 2 % (ref 0–5)
Eosinophils Absolute: 0.3 10*3/uL (ref 0.0–0.7)
HCT: 22.5 % — ABNORMAL LOW (ref 36.0–46.0)
HEMOGLOBIN: 7.2 g/dL — AB (ref 12.0–15.0)
LYMPHS ABS: 2.6 10*3/uL (ref 0.7–4.0)
Lymphocytes Relative: 18 % (ref 12–46)
MCH: 19.9 pg — ABNORMAL LOW (ref 26.0–34.0)
MCHC: 32 g/dL (ref 30.0–36.0)
MCV: 62.2 fL — ABNORMAL LOW (ref 78.0–100.0)
Monocytes Absolute: 0.8 10*3/uL (ref 0.1–1.0)
Monocytes Relative: 6 % (ref 3–12)
NEUTROS PCT: 74 % (ref 43–77)
Neutro Abs: 10.6 10*3/uL — ABNORMAL HIGH (ref 1.7–7.7)
PLATELETS: 319 10*3/uL (ref 150–400)
RBC: 3.62 MIL/uL — ABNORMAL LOW (ref 3.87–5.11)
RDW: 16.1 % — ABNORMAL HIGH (ref 11.5–15.5)
WBC: 14.2 10*3/uL — AB (ref 4.0–10.5)

## 2013-12-21 LAB — COMPREHENSIVE METABOLIC PANEL
ALK PHOS: 111 U/L (ref 39–117)
ALT: 19 U/L (ref 0–35)
AST: 24 U/L (ref 0–37)
Albumin: 2.3 g/dL — ABNORMAL LOW (ref 3.5–5.2)
Anion gap: 12 (ref 5–15)
BUN: 9 mg/dL (ref 6–23)
CALCIUM: 8 mg/dL — AB (ref 8.4–10.5)
CHLORIDE: 107 meq/L (ref 96–112)
CO2: 23 mEq/L (ref 19–32)
Creatinine, Ser: 0.75 mg/dL (ref 0.50–1.10)
GFR calc Af Amer: >90 mL/min (ref >90)
Glucose, Bld: 94 mg/dL (ref 70–99)
Potassium: 3.9 mEq/L (ref 3.7–5.3)
Sodium: 142 mEq/L (ref 137–147)
Total Bilirubin: 0.3 mg/dL (ref 0.3–1.2)
Total Protein: 6.3 g/dL (ref 6.0–8.3)

## 2013-12-21 LAB — MAGNESIUM: Magnesium: 4.4 mg/dL — ABNORMAL HIGH (ref 1.5–2.5)

## 2013-12-21 LAB — URIC ACID: URIC ACID, SERUM: 6.2 mg/dL (ref 2.4–7.0)

## 2013-12-21 LAB — LACTATE DEHYDROGENASE: LDH: 254 U/L — ABNORMAL HIGH (ref 94–250)

## 2013-12-21 LAB — MRSA PCR SCREENING: MRSA by PCR: NEGATIVE

## 2013-12-21 NOTE — Progress Notes (Signed)
S: Reports feeling much better. Denies any further SOB. Denies also h/a, visual disturbances, RUQ pain, N/V, CP or weakness. States has not required 02 since arriving to unit. Up ad lib, voiding w/o difficulty, tolerating regular diet. Breastfeeding going well. Pain well controlled. Spouse and baby rooming in. O: Has only received one dose of Labetalol (8:16 pm on 12/20/13). BPs since that time have been 117-154/63-91. Sats 91-98% on RA, HR 64-104, afebrile. Lungs: CTAB CV: RRR Abdomen: Active BS x4, soft, appropriately tender to palpation, non-distended, incision c/d/i Total uop = 1850 ml   Results for orders placed or performed during the hospital encounter of 12/20/13 (from the past 24 hour(s))  Comprehensive metabolic panel     Status: Abnormal   Collection Time: 12/20/13  3:32 PM  Result Value Ref Range   Sodium 143 137 - 147 mEq/L   Potassium 4.1 3.7 - 5.3 mEq/L   Chloride 108 96 - 112 mEq/L   CO2 21 19 - 32 mEq/L   Glucose, Bld 92 70 - 99 mg/dL   BUN 14 6 - 23 mg/dL   Creatinine, Ser 0.80 0.50 - 1.10 mg/dL   Calcium 8.9 8.4 - 10.5 mg/dL   Total Protein 7.1 6.0 - 8.3 g/dL   Albumin 2.5 (L) 3.5 - 5.2 g/dL   AST 23 0 - 37 U/L   ALT 18 0 - 35 U/L   Alkaline Phosphatase 111 39 - 117 U/L   Total Bilirubin 0.3 0.3 - 1.2 mg/dL   GFR calc non Af Amer >90 >90 mL/min   GFR calc Af Amer >90 >90 mL/min   Anion gap 14 5 - 15  CBC with Differential     Status: Abnormal   Collection Time: 12/20/13  3:32 PM  Result Value Ref Range   WBC 15.6 (H) 4.0 - 10.5 K/uL   RBC 3.71 (L) 3.87 - 5.11 MIL/uL   Hemoglobin 7.6 (L) 12.0 - 15.0 g/dL   HCT 22.9 (L) 36.0 - 46.0 %   MCV 61.7 (L) 78.0 - 100.0 fL   MCH 20.5 (L) 26.0 - 34.0 pg   MCHC 33.2 30.0 - 36.0 g/dL   RDW 16.1 (H) 11.5 - 15.5 %   Platelets 407 (H) 150 - 400 K/uL   Neutrophils Relative % 73 43 - 77 %   Lymphocytes Relative 22 12 - 46 %   Monocytes Relative 4 3 - 12 %   Eosinophils Relative 1 0 - 5 %   Basophils Relative 0 0 - 1 %   nRBC 3 (H) 0 /100 WBC   Neutro Abs 11.4 (H) 1.7 - 7.7 K/uL   Lymphs Abs 3.4 0.7 - 4.0 K/uL   Monocytes Absolute 0.6 0.1 - 1.0 K/uL   Eosinophils Absolute 0.2 0.0 - 0.7 K/uL   Basophils Absolute 0.0 0.0 - 0.1 K/uL   RBC Morphology TEARDROP CELLS    Smear Review PLATELET COUNT CONFIRMED BY SMEAR   Troponin I     Status: None   Collection Time: 12/20/13  3:32 PM  Result Value Ref Range   Troponin I <0.30 <0.30 ng/mL  Pro b natriuretic peptide (BNP)     Status: Abnormal   Collection Time: 12/20/13  3:32 PM  Result Value Ref Range   Pro B Natriuretic peptide (BNP) 2134.0 (H) 0 - 125 pg/mL  Uric acid     Status: None   Collection Time: 12/20/13  3:32 PM  Result Value Ref Range   Uric Acid, Serum 6.5 2.4 -  7.0 mg/dL  Urinalysis, Routine w reflex microscopic     Status: Abnormal   Collection Time: 12/20/13  3:57 PM  Result Value Ref Range   Color, Urine YELLOW YELLOW   APPearance CLEAR CLEAR   Specific Gravity, Urine 1.007 1.005 - 1.030   pH 6.0 5.0 - 8.0   Glucose, UA NEGATIVE NEGATIVE mg/dL   Hgb urine dipstick LARGE (A) NEGATIVE   Bilirubin Urine NEGATIVE NEGATIVE   Ketones, ur NEGATIVE NEGATIVE mg/dL   Protein, ur NEGATIVE NEGATIVE mg/dL   Urobilinogen, UA 0.2 0.0 - 1.0 mg/dL   Nitrite NEGATIVE NEGATIVE   Leukocytes, UA MODERATE (A) NEGATIVE  Urine microscopic-add on     Status: Abnormal   Collection Time: 12/20/13  3:57 PM  Result Value Ref Range   Squamous Epithelial / LPF RARE RARE   WBC, UA 7-10 <3 WBC/hpf   RBC / HPF 21-50 <3 RBC/hpf   Bacteria, UA FEW (A) RARE  Protein / creatinine ratio, urine     Status: None   Collection Time: 12/20/13  3:58 PM  Result Value Ref Range   Creatinine, Urine 33.58 mg/dL   Total Protein, Urine 5.1 mg/dL   Protein Creatinine Ratio 0.15 0.00 - 0.15  MRSA PCR Screening     Status: None   Collection Time: 12/20/13  9:30 PM  Result Value Ref Range   MRSA by PCR NEGATIVE NEGATIVE  Comprehensive metabolic panel     Status: Abnormal    Collection Time: 12/21/13  5:31 AM  Result Value Ref Range   Sodium 142 137 - 147 mEq/L   Potassium 3.9 3.7 - 5.3 mEq/L   Chloride 107 96 - 112 mEq/L   CO2 23 19 - 32 mEq/L   Glucose, Bld 94 70 - 99 mg/dL   BUN 9 6 - 23 mg/dL   Creatinine, Ser 0.75 0.50 - 1.10 mg/dL   Calcium 8.0 (L) 8.4 - 10.5 mg/dL   Total Protein 6.3 6.0 - 8.3 g/dL   Albumin 2.3 (L) 3.5 - 5.2 g/dL   AST 24 0 - 37 U/L   ALT 19 0 - 35 U/L   Alkaline Phosphatase 111 39 - 117 U/L   Total Bilirubin 0.3 0.3 - 1.2 mg/dL   GFR calc non Af Amer >90 >90 mL/min   GFR calc Af Amer >90 >90 mL/min   Anion gap 12 5 - 15  Lactate dehydrogenase     Status: Abnormal   Collection Time: 12/21/13  5:31 AM  Result Value Ref Range   LDH 254 (H) 94 - 250 U/L  Uric acid     Status: None   Collection Time: 12/21/13  5:31 AM  Result Value Ref Range   Uric Acid, Serum 6.2 2.4 - 7.0 mg/dL  Magnesium     Status: Abnormal   Collection Time: 12/21/13  5:31 AM  Result Value Ref Range   Magnesium 4.4 (H) 1.5 - 2.5 mg/dL  CBC with Differential     Status: Abnormal   Collection Time: 12/21/13  5:31 AM  Result Value Ref Range   WBC 14.2 (H) 4.0 - 10.5 K/uL   RBC 3.62 (L) 3.87 - 5.11 MIL/uL   Hemoglobin 7.2 (L) 12.0 - 15.0 g/dL   HCT 22.5 (L) 36.0 - 46.0 %   MCV 62.2 (L) 78.0 - 100.0 fL   MCH 19.9 (L) 26.0 - 34.0 pg   MCHC 32.0 30.0 - 36.0 g/dL   RDW 16.1 (H) 11.5 - 15.5 %   Platelets  319 150 - 400 K/uL   Neutrophils Relative % 74 43 - 77 %   Neutro Abs 10.6 (H) 1.7 - 7.7 K/uL   Lymphocytes Relative 18 12 - 46 %   Lymphs Abs 2.6 0.7 - 4.0 K/uL   Monocytes Relative 6 3 - 12 %   Monocytes Absolute 0.8 0.1 - 1.0 K/uL   Eosinophils Relative 2 0 - 5 %   Eosinophils Absolute 0.3 0.0 - 0.7 K/uL   Basophils Relative 0 0 - 1 %   Basophils Absolute 0.0 0.0 - 0.1 K/uL   A: 23 yo G1nowP1 admitted for postpartum preeclampsia. Condition improving.  P: Reviewed this morning's assessment and labs w/ Dr. Alesia Richards. Chest x-ray to be repeated after  discontinuation of Magnesium per Dr. Alesia Richards (order placed) - Magnesium started at 2122 on 12/20/13. Continue w/ current management plan.   Farrel Gordon, CNM 12/21/13 @ 7:02 AM

## 2013-12-21 NOTE — Plan of Care (Signed)
Problem: Consults Goal: Skin Care Protocol Initiated - if Braden Score 18 or less If consults are not indicated, leave blank or document N/A Outcome: Not Applicable Date Met:  40/97/35 Goal: Nutrition Consult-if indicated Outcome: Not Applicable Date Met:  32/99/24 Goal: Diabetes Guidelines if Diabetic/Glucose > 140 If diabetic or lab glucose is > 140 mg/dl - Initiate Diabetes/Hyperglycemia Guidelines & Document Interventions  Outcome: Not Applicable Date Met:  26/83/41  Problem: Consults Goal: Pneumonia Patient Education See Patient Educatio Module for education specifics. Outcome: Completed/Met Date Met:  12/21/13  Problem: Phase I Progression Outcomes Goal: Dyspnea controlled at rest Outcome: Completed/Met Date Met:  12/21/13 Goal: Pain controlled with appropriate interventions Outcome: Completed/Met Date Met:  12/21/13 Goal: OOB as tolerated unless otherwise ordered Outcome: Completed/Met Date Met:  12/21/13 Goal: Confirm chest x-ray completed Outcome: Completed/Met Date Met:  12/21/13 Goal: Initial discharge plan identified Outcome: Completed/Met Date Met:  12/21/13 Goal: Voiding-avoid urinary catheter unless indicated Outcome: Completed/Met Date Met:  12/21/13 Goal: Hemodynamically stable Outcome: Completed/Met Date Met:  12/21/13 Goal: Other Phase I Outcomes/Goals Outcome: Completed/Met Date Met:  12/21/13  Problem: Phase II Progression Outcomes Goal: Encourage coughing & deep breathing Outcome: Progressing Goal: Wean O2 if indicated Outcome: Progressing Goal: Pain controlled Outcome: Completed/Met Date Met:  12/21/13 Goal: Progress activity as tolerated unless otherwise ordered Outcome: Completed/Met Date Met:  12/21/13 Goal: Discharge plan established Outcome: Completed/Met Date Met:  12/21/13 Goal: Tolerating diet Outcome: Completed/Met Date Met:  12/21/13 Goal: If HF, initiate HF Core Reminder Form Outcome: Not Applicable Date Met:  96/22/29 Goal: Other  Phase II Outcomes/Goals Outcome: Completed/Met Date Met:  12/21/13  Problem: Phase III Progression Outcomes Goal: O2 sats > or equal to 93% on room air Outcome: Progressing Goal: Pain controlled on oral analgesia Outcome: Completed/Met Date Met:  12/21/13 Goal: Activity at appropriate level-compared to baseline (UP IN CHAIR FOR HEMODIALYSIS) Outcome: Progressing Goal: Tolerating diet Outcome: Completed/Met Date Met:  12/21/13 Goal: Discharge plan remains appropriate-arrangements made Outcome: Completed/Met Date Met:  12/21/13 Goal: Other Phase III Outcomes/Goals Outcome: Progressing  Problem: Discharge Progression Outcomes Goal: Barriers To Progression Addressed/Resolved Outcome: Completed/Met Date Met:  12/21/13 Goal: Discharge plan in place and appropriate Outcome: Completed/Met Date Met:  12/21/13 Goal: O2 sats at patient's baseline Outcome: Progressing Goal: Pain controlled with appropriate interventions Outcome: Completed/Met Date Met:  12/21/13 Goal: Hemodynamically stable Outcome: Completed/Met Date Met:  79/89/21 Goal: Complications resolved/controlled Outcome: Progressing Goal: Tolerating diet Outcome: Completed/Met Date Met:  12/21/13 Goal: Activity appropriate for discharge plan Outcome: Completed/Met Date Met:  12/21/13 Goal: Other Discharge Outcomes/Goals Outcome: Progressing

## 2013-12-21 NOTE — Progress Notes (Signed)
Hayley Herring  Postpartum Day 5 LOS: 1  Subjective Chest X-Ray Reviewed  Objective  Filed Vitals:   12/21/13 1900 12/21/13 2013 12/21/13 2106 12/21/13 2125  BP:  150/89 155/83 145/91  Pulse:   86 92  Temp:  98.5 F (36.9 C)    TempSrc:  Oral    Resp: 16 16 16 16   Height:      Weight:      SpO2:        HEMOGLOBIN  Date/Time Value Ref Range Status  12/21/2013 05:31 AM 7.2* 12.0 - 15.0 g/dL Final  12/20/2013 03:32 PM 7.6* 12.0 - 15.0 g/dL Final   HCT  Date/Time Value Ref Range Status  12/21/2013 05:31 AM 22.5* 36.0 - 46.0 % Final  12/20/2013 03:32 PM 22.9* 36.0 - 46.0 % Final   PLATELETS  Date/Time Value Ref Range Status  12/21/2013 05:31 AM 319 150 - 400 K/uL Final  12/20/2013 03:32 PM 407* 150 - 400 K/uL Final    Comment:    SPECIMEN CHECKED FOR CLOTS PLATELET COUNT CONFIRMED BY SMEAR PLATELETS APPEAR ADEQUATE    Chest Ray: FINDINGS: Interval improvement in airspace disease seen previously in the retrocardiac left lung base. No airspace pulmonary edema. The left pleural effusion appears minimally decreased in the interval. The cardiopericardial silhouette is within normal limits for size. Imaged bony structures of the thorax are intact.  IMPRESSION: Interval improvement in left base airspace disease and small left pleural effusion.  PE:  Deferred  Assessment Postpartum Day 5 S/P Primary C/S Readmitted for SOB PP PreEclampsia  Plan -MgSO4 Discontinued at 2125 -Chest X-Ray complete--improvements -Continue other mgmt as ordered  Deontray Hunnicutt LYNN, CNM 12/21/2013, 10:38 PM

## 2013-12-21 NOTE — Consult Note (Signed)
Visit with mom who has been readmitted to AICU.  Mom states she met with a lactation consultant at her pediatricians office yesterday.  Her milk is in and baby is feeding every 2 hours.  Mom has questions about obtaining a breast pump from her insurance company.  She called and they will Fedex a pump in 1-2 days.  Mom denies any further questions or concerns.  Encouraged to call with concerns prn.

## 2013-12-21 NOTE — Progress Notes (Signed)
Subjective: Hospital Day #1 Postpartum Day 5: Cesarean Delivery due to FTD, readmission due to SOB, f/u dx of pre-eclampsia Patient up ad lib, reports no syncope or dizziness.  Has very mild SOB on exertion now, much improved from admission.  Denies HA, visual sx, or epigastric pain. Feeding:  Breast  Magnesium sulfate started 2122.   Objective: Vital signs in last 24 hours: Temp:  [98.2 F (36.8 C)-99.1 F (37.3 C)] 98.3 F (36.8 C) (11/04 0800) Pulse Rate:  [64-131] 107 (11/04 1000) Resp:  [16-24] 18 (11/04 1000) BP: (117-178)/(63-111) 147/87 mmHg (11/04 1000) SpO2:  [91 %-100 %] 96 % (11/04 1000) Weight:  [174 lb 6.4 oz (79.107 kg)] 174 lb 6.4 oz (79.107 kg) (11/03 2110)   Weight 179 on 12/15/13  I/O last shift:  - 675.8 Urine output this shift = 900 cc.  Filed Vitals:   12/21/13 0700 12/21/13 0800 12/21/13 0935 12/21/13 1000  BP: 147/89 139/86 140/91 147/87  Pulse: 100  131 107  Temp:  98.3 F (36.8 C)    TempSrc:  Oral    Resp:  18 18 18   Height:      Weight:      SpO2: 91% 98% 98% 96%    Physical Exam:  General: alert Lochia: appropriate Uterine Fundus: firm Abdomen:  + bowel sounds Incision: healing well DVT Evaluation: No evidence of DVT seen on physical exam. Negative Homan's sign. DTR 2+, no clonus, 1+ edema    Recent Labs  12/20/13 1532 12/21/13 0531  HGB 7.6* 7.2*  HCT 22.9* 22.5*  WBC 15.6* 14.2*   CBC Latest Ref Rng 12/21/2013 12/20/2013 12/18/2013  WBC 4.0 - 10.5 K/uL 14.2(H) 15.6(H) 14.9(H)  Hemoglobin 12.0 - 15.0 g/dL 7.2(L) 7.6(L) 7.2(L)  Hematocrit 36.0 - 46.0 % 22.5(L) 22.9(L) 21.5(L)  Platelets 150 - 400 K/uL 319 407(H) 214   CMP     Component Value Date/Time   NA 142 12/21/2013 0531   K 3.9 12/21/2013 0531   CL 107 12/21/2013 0531   CO2 23 12/21/2013 0531   GLUCOSE 94 12/21/2013 0531   BUN 9 12/21/2013 0531   CREATININE 0.75 12/21/2013 0531   CALCIUM 8.0* 12/21/2013 0531   PROT 6.3 12/21/2013 0531   ALBUMIN 2.3*  12/21/2013 0531   AST 24 12/21/2013 0531   ALT 19 12/21/2013 0531   ALKPHOS 111 12/21/2013 0531   BILITOT 0.3 12/21/2013 0531   GFRNONAA >90 12/21/2013 0531   GFRAA >90 12/21/2013 0531    Assessment/Plan: Status post Cesarean section day 5, readmit for SOB, pre-eclampsia Beta thalassemia, chronic anemia--without hemodynamic instability Mild tachycardia Stable Continue current care. Anticipate d/c of magnesium at 2122. Repeat CXR ordered for this evening. Patient anxious to go home, understands would likely be tomorrow vs. today. Dr. Charlesetta Garibaldi will see today.  Hayley Herring, [place], [place] 12/21/2013, 11:11 AM

## 2013-12-22 MED ORDER — LABETALOL HCL 200 MG PO TABS
200.0000 mg | ORAL_TABLET | Freq: Two times a day (BID) | ORAL | Status: DC
  Administered 2013-12-22: 200 mg via ORAL
  Filled 2013-12-22 (×3): qty 1

## 2013-12-22 MED ORDER — LABETALOL HCL 200 MG PO TABS: 200.0000 mg | ORAL_TABLET | Freq: Two times a day (BID) | ORAL | Status: DC

## 2013-12-22 NOTE — Discharge Summary (Signed)
Cesarean Section Delivery Discharge Summary  Hayley Herring  DOB:    27-Nov-1990 MRN:    [id number] CSN:    [id number]  Date of re-admission:                  12/20/13  Date of discharge:                   12/22/13  Procedures this admission:  Preeclampsia support w/ magnesium sulfate   Date of Delivery: 12/16/13  Newborn Data:  Live born  Information for the patient's newborn:  Bruna Potter [[id number]]  female  Live born female  Birth Weight: 6 lb 13 oz (3090 g) at birth   History of Present Illness:  Ms. Hayley Herring is a 23 y.o. female, G1P1001, who presents at [redacted]w[redacted]d weeks gestation. The patient has been followed at the [facility] and Gynecology division of [facility].    Her pregnancy has been complicated by:  Patient Active Problem List   Diagnosis Date Noted  . Hypertension in pregnancy, preeclampsia, severe, delivered/postpartum 12/20/2013  . Overweight 12/20/2013  . Short stature 12/20/2013  . Cesarean delivery delivered 12/16/2013  . Hydradenitis 12/15/2013  . Thalassemia trait, beta--Hgb 8.9 on 10/03/13 10/13/2013    Hospital course: The patient was admitted for SOB and Preclampsia treated with Magnesium x 24  Hours  Feeding: breast    Discharge hemoglobin: HEMOGLOBIN  Date Value Ref Range Status  12/21/2013 7.2* 12.0 - 15.0 g/dL Final   HCT  Date Value Ref Range Status  12/21/2013 22.5* 36.0 - 46.0 % Final   Chronic anemia d/t Beta thalassemia    PreNatal Labs ABO, Rh: --/--/AB POS, AB POS (10/29 1100)   Antibody: NEG (10/29 1100) Rubella:   immune RPR: NON REAC (10/29 1100)  HBsAg: Negative (04/02 0000)  HIV: NONREACTIVE (10/29 1100)  GBS: Positive (10/05 0000)  Filed Vitals:   12/22/13 0549 12/22/13 0642 12/22/13 0735 12/22/13 1410  BP: 135/80  146/81 134/75  Pulse:      Temp: 98.7 F (37.1 C)  98.4 F (36.9 C)   TempSrc: Oral  Oral   Resp: 16 16 16 18   Height:      Weight:  78.654 kg (173 lb 6.4  oz)    SpO2:   100%    Discharge Physical Exam:  General: alert and cooperative Lochia: appropriate Uterine Fundus: firm Incision: healing well DVT Evaluation: No evidence of DVT seen on physical exam.  Postpartum Procedures: Magnesium x 24  Hours Complications-Operative and Postpartum: none  Discharge Diagnoses: postpartum pre eclampsia   Activity:           pelvic rest Diet:                routine Medications: con't pp Rx as prescribed Condition:      stable   Discharge to: home Smart start nurse to visit on Saturday for BP check Dc home with Labetalol 200mg  PO BID    Ad Guttman, CNM, MSN  12/22/2013. 10:47 AM  Care After Cesarean Delivery  Refer to this sheet in the next few weeks. These instructions provide you with information on caring for yourself after your procedure. Your caregiver may also give you specific instructions. Your treatment has been planned according to current medical practices, but problems sometimes occur. Call your caregiver if you have any problems or questions after you go home. HOME CARE INSTRUCTIONS  Only take over-the-counter or prescription medicines as directed by your caregiver.  Do not drink alcohol, especially if you are breastfeeding or taking medicine to relieve pain.  Do not chew or smoke tobacco.  Continue to use good perineal care. Good perineal care includes:  Wiping your perineum from front to back.  Keeping your perineum clean.  Check your cut (incision) daily for increased redness, drainage, swelling, or separation of skin.  Clean your incision gently with soap and water every day, and then pat it dry. If your caregiver says it is okay, leave the incision uncovered. Use a bandage (dressing) if the incision is draining fluid or appears irritated. If the adhesive strips across the incision do not fall off within 7 days, carefully peel them off.  Hug a pillow when coughing or sneezing until your incision is healed. This  helps to relieve pain.  Do not use tampons or douche until your caregiver says it is okay.  Shower, wash your hair, and take tub baths as directed by your caregiver.  Wear a well-fitting bra that provides breast support.  Limit wearing support panties or control-top hose.  Drink enough fluids to keep your urine clear or pale yellow.  Eat high-fiber foods such as whole grain cereals and breads, brown rice, beans, and fresh fruits and vegetables every day. These foods may help prevent or relieve constipation.  Resume activities such as climbing stairs, driving, lifting, exercising, or traveling as directed by your caregiver.  Talk to your caregiver about resuming sexual activities. This is dependent upon your risk of infection, your rate of healing, and your comfort and desire to resume sexual activity.  Try to have someone help you with your household activities and your newborn for at least a few days after you leave the hospital.  Rest as much as possible. Try to rest or take a nap when your newborn is sleeping.  Increase your activities gradually.  Keep all of your scheduled postpartum appointments. It is very important to keep your scheduled follow-up appointments. At these appointments, your caregiver will be checking to make sure that you are healing physically and emotionally. SEEK MEDICAL CARE IF:   You are passing large clots from your vagina. Save any clots to show your caregiver.  You have a foul smelling discharge from your vagina.  You have trouble urinating.  You are urinating frequently.  You have pain when you urinate.  You have a change in your bowel movements.  You have increasing redness, pain, or swelling near your incision.  You have pus draining from your incision.  Your incision is separating.  You have painful, hard, or reddened breasts.  You have a severe headache.  You have blurred vision or see spots.  You feel sad or depressed.  You have  thoughts of hurting yourself or your newborn.  You have questions about your care, the care of your newborn, or medicines.  You are dizzy or lightheaded.  You have a rash.  You have pain, redness, or swelling at the site of the removed intravenous access (IV) tube.  You have nausea or vomiting.  You stopped breastfeeding and have not had a menstrual period within 12 weeks of stopping.  You are not breastfeeding and have not had a menstrual period within 12 weeks of delivery.  You have a fever. SEEK IMMEDIATE MEDICAL CARE IF:  You have persistent pain.  You have chest pain.  You have shortness of breath.  You faint.  You have leg pain.  You have stomach pain.  Your vaginal bleeding saturates  2 or more sanitary pads in 1 hour. MAKE SURE YOU:   Understand these instructions.  Will watch your condition.  Will get help right away if you are not doing well or get worse. Document Released: 10/26/2001 Document Revised: 10/29/2011 Document Reviewed: 10/01/2011 [facility] Patient Information 2014 [place].   Postpartum Depression and Baby Blues  The postpartum period begins right after the birth of a baby. During this time, there is often a great amount of joy and excitement. It is also a time of considerable changes in the life of the parent(s). Regardless of how many times a mother gives birth, each child brings new challenges and dynamics to the family. It is not unusual to have feelings of excitement accompanied by confusing shifts in moods, emotions, and thoughts. All mothers are at risk of developing postpartum depression or the "baby blues." These mood changes can occur right after giving birth, or they may occur many months after giving birth. The baby blues or postpartum depression can be mild or severe. Additionally, postpartum depression can resolve rather quickly, or it can be a long-term condition. CAUSES Elevated hormones and their rapid decline are thought to  be a main cause of postpartum depression and the baby blues. There are a number of hormones that radically change during and after pregnancy. Estrogen and progesterone usually decrease immediately after delivering your baby. The level of thyroid hormone and various cortisol steroids also rapidly drop. Other factors that play a major role in these changes include major life events and genetics.  RISK FACTORS If you have any of the following risks for the baby blues or postpartum depression, know what symptoms to watch out for during the postpartum period. Risk factors that may increase the likelihood of getting the baby blues or postpartum depression include: 1. Havinga personal or family history of depression. 2. Having depression while being pregnant. 3. Having premenstrual or oral contraceptive-associated mood issues. 4. Having exceptional life stress. 5. Having marital conflict. 6. Lacking a social support network. 7. Having a baby with special needs. 8. Having health problems such as diabetes. SYMPTOMS Baby blues symptoms include:  Brief fluctuations in mood, such as going from extreme happiness to sadness.  Decreased concentration.  Difficulty sleeping.  Crying spells, tearfulness.  Irritability.  Anxiety. Postpartum depression symptoms typically begin within the first month after giving birth. These symptoms include:  Difficulty sleeping or excessive sleepiness.  Marked weight loss.  Agitation.  Feelings of worthlessness.  Lack of interest in activity or food. Postpartum psychosis is a very concerning condition and can be dangerous. Fortunately, it is rare. Displaying any of the following symptoms is cause for immediate medical attention. Postpartum psychosis symptoms include:  Hallucinations and delusions.  Bizarre or disorganized behavior.  Confusion or disorientation. DIAGNOSIS  A diagnosis is made by an evaluation of your symptoms. There are no medical or lab  tests that lead to a diagnosis, but there are various questionnaires that a caregiver may use to identify those with the baby blues, postpartum depression, or psychosis. Often times, a screening tool called the [place] Postnatal Depression Scale is used to diagnose depression in the postpartum period.  TREATMENT The baby blues usually goes away on its own in 1 to 2 weeks. Social support is often all that is needed. You should be encouraged to get adequate sleep and rest. Occasionally, you may be given medicines to help you sleep.  Postpartum depression requires treatment as it can last several months or longer if it is  not treated. Treatment may include individual or group therapy, medicine, or both to address any social, physiological, and psychological factors that may play a role in the depression. Regular exercise, a healthy diet, rest, and social support may also be strongly recommended.  Postpartum psychosis is more serious and needs treatment right away. Hospitalization is often needed. HOME CARE INSTRUCTIONS  Get as much rest as you can. Nap when the baby sleeps.  Exercise regularly. Some women find yoga and walking to be beneficial.  Eat a balanced and nourishing diet.  Do little things that you enjoy. Have a cup of tea, take a bubble bath, read your favorite magazine, or listen to your favorite music.  Avoid alcohol.  Ask for help with household chores, cooking, grocery shopping, or running errands as needed. Do not try to do everything.  Talk to people close to you about how you are feeling. Get support from your partner, family members, friends, or other new moms.  Try to stay positive in how you think. Think about the things you are grateful for.  Do not spend a lot of time alone.  Only take medicine as directed by your caregiver.  Keep all your postpartum appointments.  Let your caregiver know if you have any concerns. SEEK MEDICAL CARE IF: You are having a reaction or  problems with your medicine. SEEK IMMEDIATE MEDICAL CARE IF:  You have suicidal feelings.  You feel you may harm the baby or someone else. Document Released: 11/08/2003 Document Revised: 04/28/2011 Document Reviewed: 12/10/2010 [facility] Patient Information 2014 [place], [place].   Breastfeeding Deciding to breastfeed is one of the best choices you can make for you and your baby. A change in hormones during pregnancy causes your breast tissue to grow and increases the number and size of your milk ducts. These hormones also allow proteins, sugars, and fats from your blood supply to make breast milk in your milk-producing glands. Hormones prevent breast milk from being released before your baby is born as well as prompt milk flow after birth. Once breastfeeding has begun, thoughts of your baby, as well as his or her sucking or crying, can stimulate the release of milk from your milk-producing glands.  BENEFITS OF BREASTFEEDING For Your Baby  Your first milk (colostrum) helps your baby's digestive system function better.   There are antibodies in your milk that help your baby fight off infections.   Your baby has a lower incidence of asthma, allergies, and sudden infant death syndrome.   The nutrients in breast milk are better for your baby than infant formulas and are designed uniquely for your baby's needs.   Breast milk improves your baby's brain development.   Your baby is less likely to develop other conditions, such as childhood obesity, asthma, or type 2 diabetes mellitus.  For You   Breastfeeding helps to create a very special bond between you and your baby.   Breastfeeding is convenient. Breast milk is always available at the correct temperature and costs nothing.   Breastfeeding helps to burn calories and helps you lose the weight gained during pregnancy.   Breastfeeding makes your uterus contract to its prepregnancy size faster and slows bleeding (lochia) after you  give birth.   Breastfeeding helps to lower your risk of developing type 2 diabetes mellitus, osteoporosis, and breast or ovarian cancer later in life. SIGNS THAT YOUR BABY IS HUNGRY Early Signs of Hunger  Increased alertness or activity.  Stretching.  Movement of the head from side to  side.  Movement of the head and opening of the mouth when the corner of the mouth or cheek is stroked (rooting).  Increased sucking sounds, smacking lips, cooing, sighing, or squeaking.  Hand-to-mouth movements.  Increased sucking of fingers or hands. Late Signs of Hunger  Fussing.  Intermittent crying. Extreme Signs of Hunger Signs of extreme hunger will require calming and consoling before your baby will be able to breastfeed successfully. Do not wait for the following signs of extreme hunger to occur before you initiate breastfeeding:   Restlessness.  A loud, strong cry.   Screaming.  BREASTFEEDING BASICS Breastfeeding Initiation  Find a comfortable place to sit or lie down, with your neck and back well supported.  Place a pillow or rolled up blanket under your baby to bring him or her to the level of your breast (if you are seated). Nursing pillows are specially designed to help support your arms and your baby while you breastfeed.  Make sure that your baby's abdomen is facing your abdomen.   Gently massage your breast. With your fingertips, massage from your chest wall toward your nipple in a circular motion. This encourages milk flow. You may need to continue this action during the feeding if your milk flows slowly.  Support your breast with 4 fingers underneath and your thumb above your nipple. Make sure your fingers are well away from your nipple and your baby's mouth.   Stroke your baby's lips gently with your finger or nipple.   When your baby's mouth is open wide enough, quickly bring your baby to your breast, placing your entire nipple and as much of the colored area  around your nipple (areola) as possible into your baby's mouth.   More areola should be visible above your baby's upper lip than below the lower lip.   Your baby's tongue should be between his or her lower gum and your breast.   Ensure that your baby's mouth is correctly positioned around your nipple (latched). Your baby's lips should create a seal on your breast and be turned out (everted).  It is common for your baby to suck about 2-3 minutes in order to start the flow of breast milk. Latching Teaching your baby how to latch on to your breast properly is very important. An improper latch can cause nipple pain and decreased milk supply for you and poor weight gain in your baby. Also, if your baby is not latched onto your nipple properly, he or she may swallow some air during feeding. This can make your baby fussy. Burping your baby when you switch breasts during the feeding can help to get rid of the air. However, teaching your baby to latch on properly is still the best way to prevent fussiness from swallowing air while breastfeeding. Signs that your baby has successfully latched on to your nipple:    Silent tugging or silent sucking, without causing you pain.   Swallowing heard between every 3-4 sucks.    Muscle movement above and in front of his or her ears while sucking.  Signs that your baby has not successfully latched on to nipple:   Sucking sounds or smacking sounds from your baby while breastfeeding.  Nipple pain. If you think your baby has not latched on correctly, slip your finger into the corner of your baby's mouth to break the suction and place it between your baby's gums. Attempt breastfeeding initiation again. Signs of Successful Breastfeeding Signs from your baby:   A gradual decrease in  the number of sucks or complete cessation of sucking.   Falling asleep.   Relaxation of his or her body.   Retention of a small amount of milk in his or her mouth.    Letting go of your breast by himself or herself. Signs from you:  Breasts that have increased in firmness, weight, and size 1-3 hours after feeding.   Breasts that are softer immediately after breastfeeding.  Increased milk volume, as well as a change in milk consistency and color by the fifth day of breastfeeding.   Nipples that are not sore, cracked, or bleeding. Signs That Your [employer] is Getting Enough Milk  Wetting at least 3 diapers in a 24-hour period. The urine should be clear and pale yellow by age 30 days.  At least 3 stools in a 24-hour period by age 30 days. The stool should be soft and yellow.  At least 3 stools in a 24-hour period by age 75 days. The stool should be seedy and yellow.  No loss of weight greater than 10% of birth weight during the first 52 days of age.  Average weight gain of 4-7 ounces (113-198 g) per week after age 79 days.  Consistent daily weight gain by age 95 days, without weight loss after the age of 2 weeks. After a feeding, your baby may spit up a small amount. This is common. BREASTFEEDING FREQUENCY AND DURATION Frequent feeding will help you make more milk and can prevent sore nipples and breast engorgement. Breastfeed when you feel the need to reduce the fullness of your breasts or when your baby shows signs of hunger. This is called "breastfeeding on demand." Avoid introducing a pacifier to your baby while you are working to establish breastfeeding (the first 4-6 weeks after your baby is born). After this time you may choose to use a pacifier. Research has shown that pacifier use during the first year of a baby's life decreases the risk of sudden infant death syndrome (SIDS). Allow your baby to feed on each breast as long as he or she wants. Breastfeed until your baby is finished feeding. When your baby unlatches or falls asleep while feeding from the first breast, offer the second breast. Because newborns are often sleepy in the first few weeks of  life, you may need to awaken your baby to get him or her to feed. Breastfeeding times will vary from baby to baby. However, the following rules can serve as a guide to help you ensure that your baby is properly fed:  Newborns (babies 76 weeks of age or younger) may breastfeed every 1-3 hours.  Newborns should not go longer than 3 hours during the day or 5 hours during the night without breastfeeding.  You should breastfeed your baby a minimum of 8 times in a 24-hour period until you begin to introduce solid foods to your baby at around 73 months of age. BREAST MILK PUMPING Pumping and storing breast milk allows you to ensure that your baby is exclusively fed your breast milk, even at times when you are unable to breastfeed. This is especially important if you are going back to work while you are still breastfeeding or when you are not able to be present during feedings. Your lactation consultant can give you guidelines on how long it is safe to store breast milk.  A breast pump is a machine that allows you to pump milk from your breast into a sterile bottle. The pumped breast milk can then be  stored in a refrigerator or freezer. Some breast pumps are operated by hand, while others use electricity. Ask your lactation consultant which type will work best for you. Breast pumps can be purchased, but some hospitals and breastfeeding support groups lease breast pumps on a monthly basis. A lactation consultant can teach you how to hand express breast milk, if you prefer not to use a pump.  CARING FOR YOUR BREASTS WHILE YOU BREASTFEED Nipples can become dry, cracked, and sore while breastfeeding. The following recommendations can help keep your breasts moisturized and healthy:  Avoid using soap on your nipples.   Wear a supportive bra. Although not required, special nursing bras and tank tops are designed to allow access to your breasts for breastfeeding without taking off your entire bra or top. Avoid  wearing underwire-style bras or extremely tight bras.  Air dry your nipples for 3-13minutes after each feeding.   Use only cotton bra pads to absorb leaked breast milk. Leaking of breast milk between feedings is normal.   Use lanolin on your nipples after breastfeeding. Lanolin helps to maintain your skin's normal moisture barrier. If you use pure lanolin, you do not need to wash it off before feeding your baby again. Pure lanolin is not toxic to your baby. You may also hand express a few drops of breast milk and gently massage that milk into your nipples and allow the milk to air dry. In the first few weeks after giving birth, some women experience extremely full breasts (engorgement). Engorgement can make your breasts feel heavy, warm, and tender to the touch. Engorgement peaks within 3-5 days after you give birth. The following recommendations can help ease engorgement:  Completely empty your breasts while breastfeeding or pumping. You may want to start by applying warm, moist heat (in the shower or with warm water-soaked hand towels) just before feeding or pumping. This increases circulation and helps the milk flow. If your baby does not completely empty your breasts while breastfeeding, pump any extra milk after he or she is finished.  Wear a snug bra (nursing or regular) or tank top for 1-2 days to signal your body to slightly decrease milk production.  Apply ice packs to your breasts, unless this is too uncomfortable for you.  Make sure that your baby is latched on and positioned properly while breastfeeding. If engorgement persists after 48 hours of following these recommendations, contact your health care provider or a Science writer. OVERALL HEALTH CARE RECOMMENDATIONS WHILE BREASTFEEDING  Eat healthy foods. Alternate between meals and snacks, eating 3 of each per day. Because what you eat affects your breast milk, some of the foods may make your baby more irritable than usual.  Avoid eating these foods if you are sure that they are negatively affecting your baby.  Drink milk, fruit juice, and water to satisfy your thirst (about 10 glasses a day).   Rest often, relax, and continue to take your prenatal vitamins to prevent fatigue, stress, and anemia.  Continue breast self-awareness checks.  Avoid chewing and smoking tobacco.  Avoid alcohol and drug use. Some medicines that may be harmful to your baby can pass through breast milk. It is important to ask your health care provider before taking any medicine, including all over-the-counter and prescription medicine as well as vitamin and herbal supplements. It is possible to become pregnant while breastfeeding. If birth control is desired, ask your health care provider about options that will be safe for your baby. SEEK MEDICAL CARE IF:  You feel like you want to stop breastfeeding or have become frustrated with breastfeeding.  You have painful breasts or nipples.  Your nipples are cracked or bleeding.  Your breasts are red, tender, or warm.  You have a swollen area on either breast.  You have a fever or chills.  You have nausea or vomiting.  You have drainage other than breast milk from your nipples.  Your breasts do not become full before feedings by the fifth day after you give birth.  You feel sad and depressed.  Your baby is too sleepy to eat well.  Your baby is having trouble sleeping.   Your baby is wetting less than 3 diapers in a 24-hour period.  Your baby has less than 3 stools in a 24-hour period.  Your baby's skin or the white part of his or her eyes becomes yellow.   Your baby is not gaining weight by 48 days of age. SEEK IMMEDIATE MEDICAL CARE IF:   Your baby is overly tired (lethargic) and does not want to wake up and feed.  Your baby develops an unexplained fever. Document Released: 02/03/2005 Document Revised: 02/08/2013 Document Reviewed: 07/28/2012 [facility] Patient  Information 2015 [place], [place]. This information is not intended to replace advice given to you by your health care provider. Make sure you discuss any questions you have with your health care provider.

## 2013-12-22 NOTE — Progress Notes (Signed)
Subjective: Postpartum Day 6:  Cesarean Delivery due to FTD, Readmission d/t SOB Pt report feeling much better.  Today she denies SOB, vision changes, RUQ pain syncope or dizziness at rest or ambulation Infant at the bedside, breastfeeding  Objective: Vital signs in last 24 hours: Temp:  [98.3 F (36.8 C)-98.7 F (37.1 C)] 98.4 F (36.9 C) (11/05 0735) Pulse Rate:  [81-102] 102 (11/04 2237) Resp:  [14-16] 16 (11/05 0735) BP: (121-155)/(67-91) 146/81 mmHg (11/05 0735) SpO2:  [92 %-100 %] 100 % (11/05 0735) Weight:  [78.654 kg (173 lb 6.4 oz)-78.926 kg (174 lb)] 78.654 kg (173 lb 6.4 oz) (11/05 0300)  I/O last 3 completed shifts: In: 6256.3 [P.O.:3180; I.V.:3076.3] Out: 6350 [Urine:6350]   Filed Vitals:   12/22/13 0500 12/22/13 0549 12/22/13 0642 12/22/13 0735  BP:  135/80  146/81  Pulse:      Temp:  98.7 F (37.1 C)  98.4 F (36.9 C)  TempSrc:  Oral  Oral  Resp: 16 16 16 16   Height:      Weight:   78.654 kg (173 lb 6.4 oz)   SpO2:    100%    Physical Exam:  General: alert and cooperative Lochia: appropriate Uterine Fundus: firm Abdomen:  + bowel sounds, non distended Incision: healing well  DVT Evaluation: No evidence of DVT seen on physical exam. Homan's sign: Negative, no edema   Recent Labs  12/20/13 1532 12/21/13 0531  HGB 7.6* 7.2*  HCT 22.9* 22.5*  WBC 15.6* 14.2*   Chest Ray: FINDINGS: Interval improvement in airspace disease seen previously in the retrocardiac left lung base. No airspace pulmonary edema. The left pleural effusion appears minimally decreased in the interval. The cardiopericardial silhouette is within normal limits for size. Imaged bony structures of the thorax are intact.  IMPRESSION: Interval improvement in left base airspace disease and small left pleural effusion.  Assessment: Status post primary Cesarean section day 6, readmitted for SOB Doing well postoperatively.  Beta thalassemia, chronic anemia  Hemodynamicly  stable Preelampsia treated with MgSO4 x 24 hours Chest xray shows improvement  Plan: Continue current care. Labetalol 200mg  PO Now Recheck BP this afternoon  Dr. Mancel Bale updated on patient status     Marcie Shearon, CNM, MSN 12/22/2013. 10:03 AM

## 2016-05-04 IMAGING — CR DG CHEST 2V
2 series · 2 of 2 positions shown · non-contrast
Comparison: 12/20/2013.

CLINICAL DATA: Subsequent encounter for left lower lobe pneumonia
with minimal pleural effusion.

EXAM:
CHEST  2 VIEW

[view not recorded (1 of 2)]
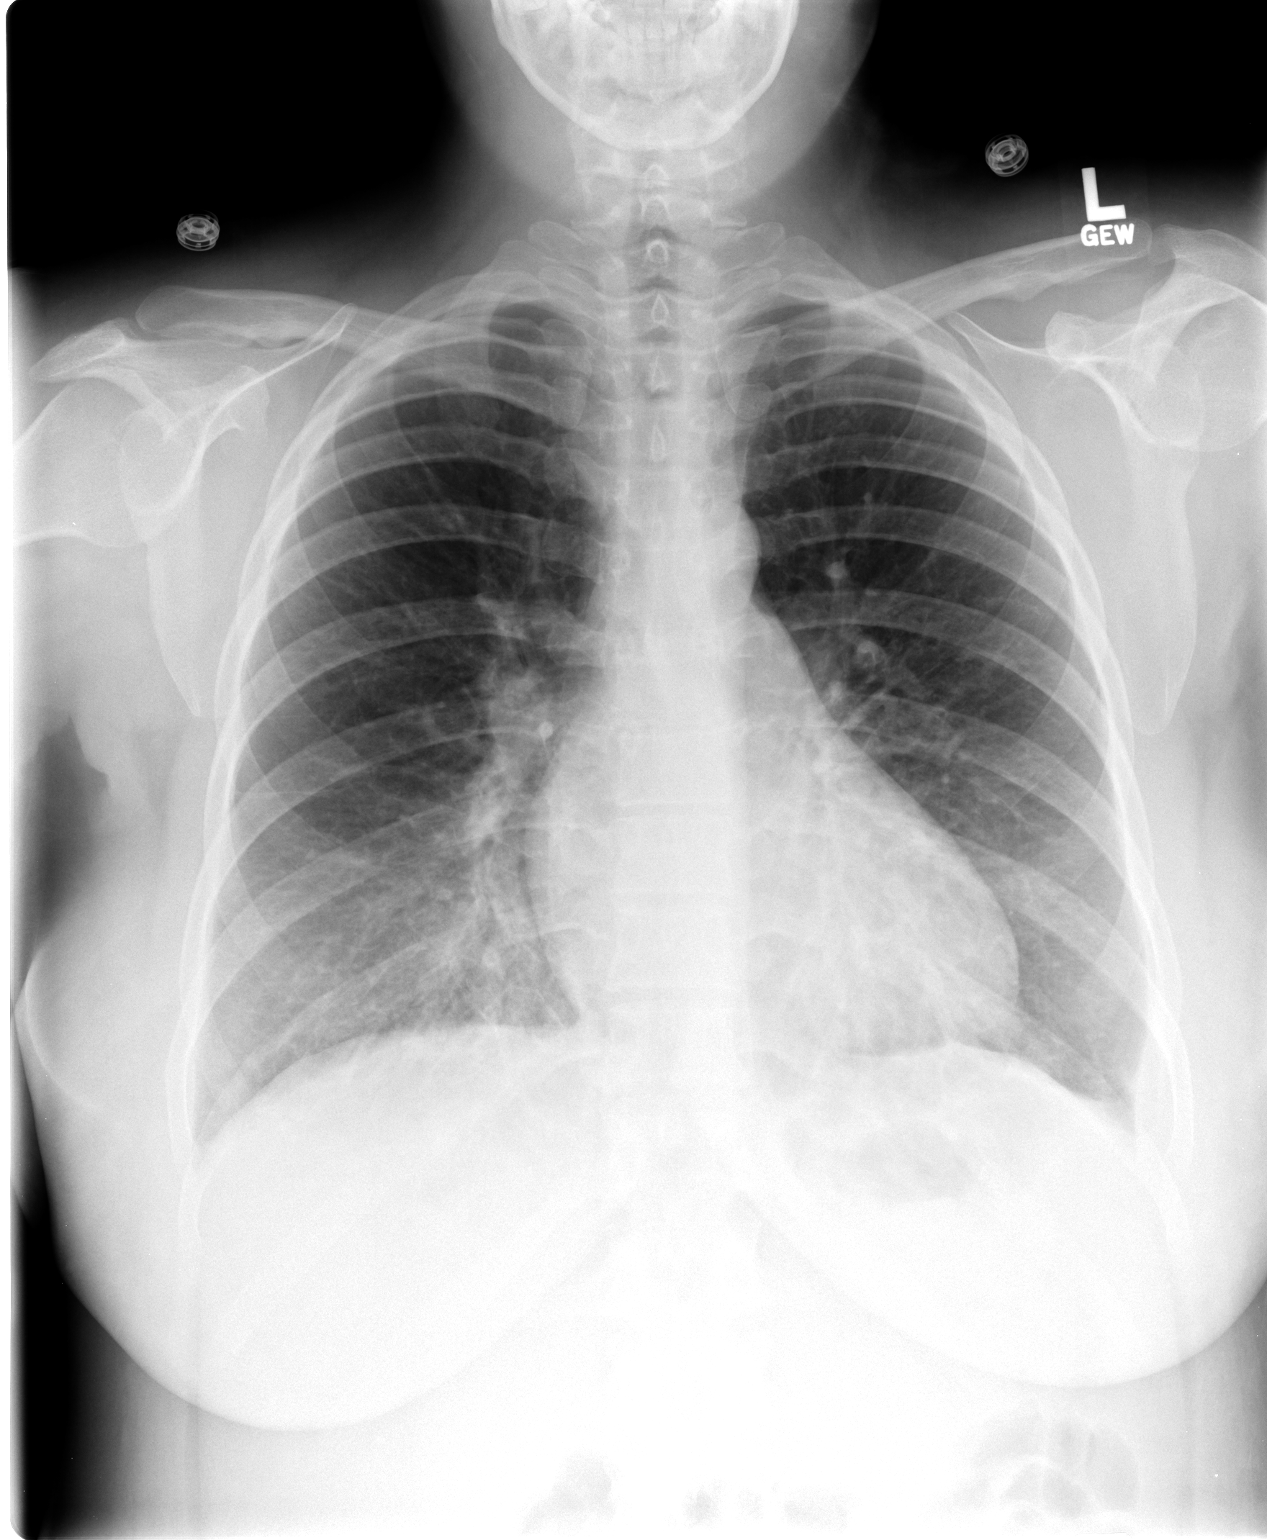

[view not recorded (2 of 2)]
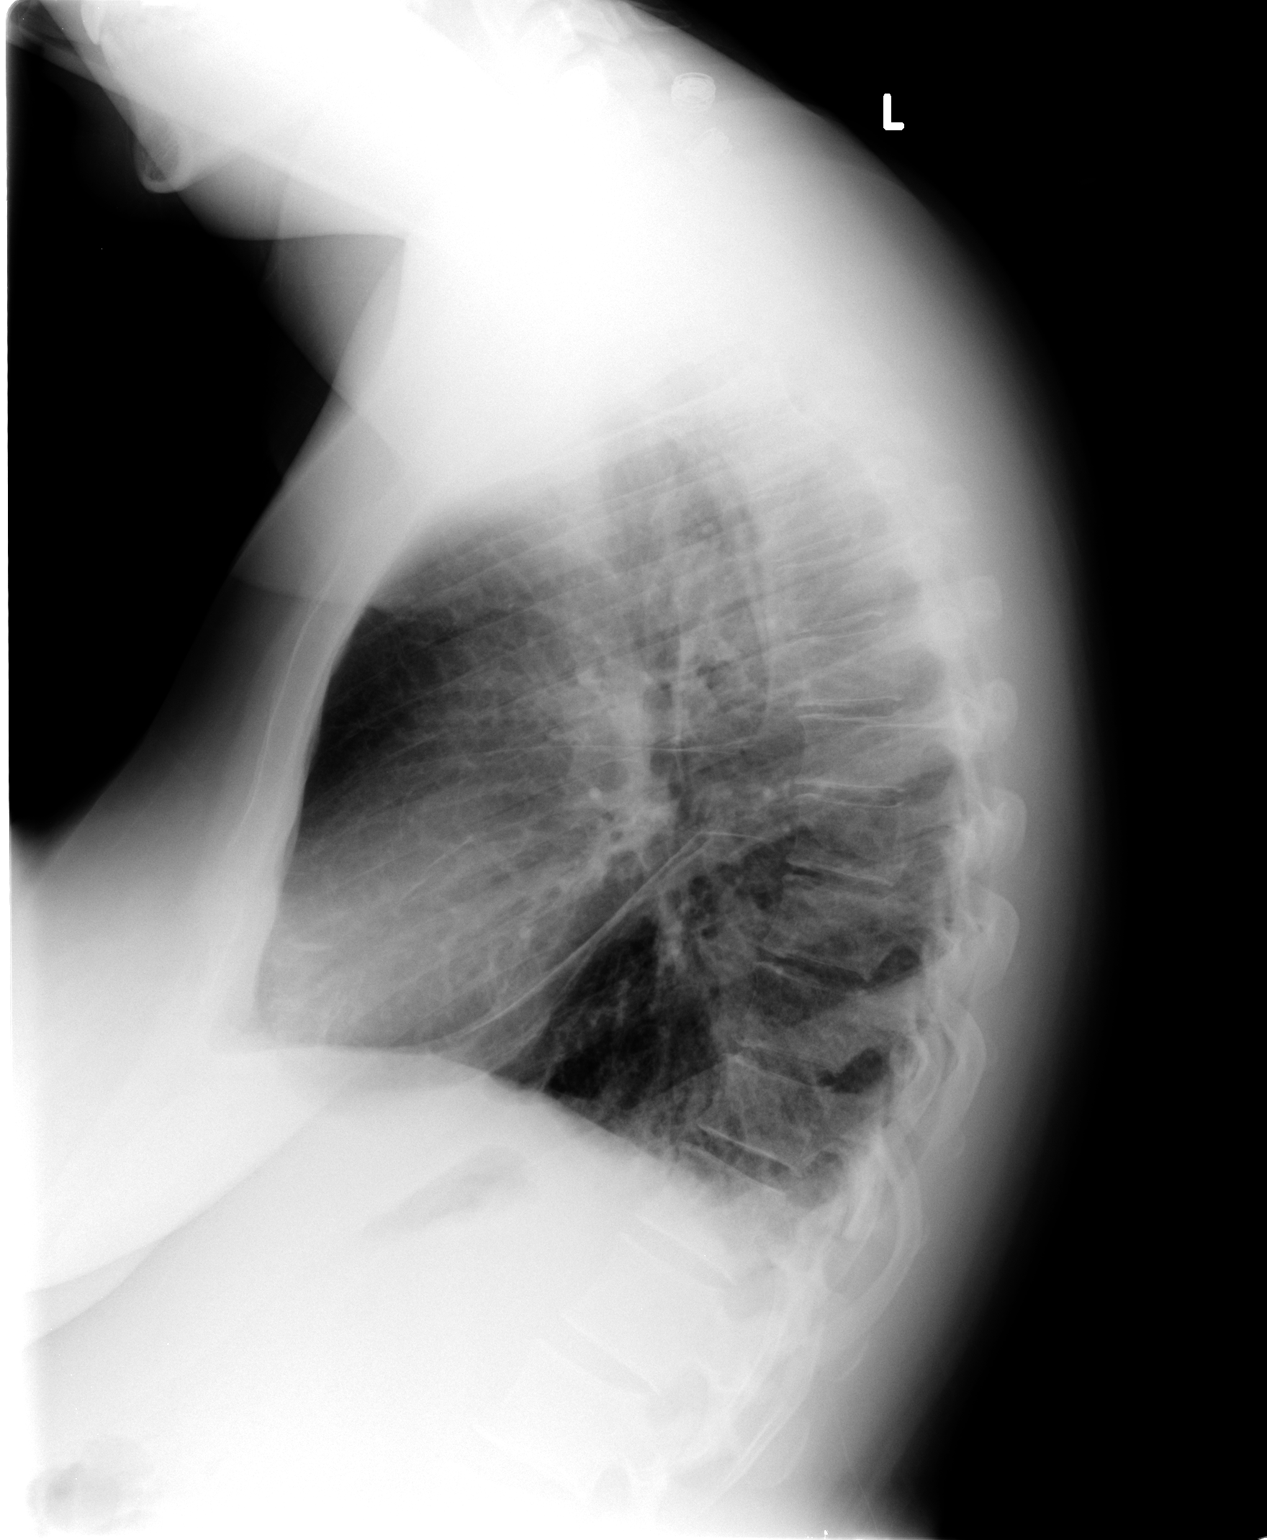

[2 of 2 positions shown; findings below may reference images not displayed]

FINDINGS: Interval improvement in airspace disease seen previously in the
retrocardiac left lung base. No airspace pulmonary edema. The left
pleural effusion appears minimally decreased in the interval. The
cardiopericardial silhouette is within normal limits for size.
Imaged bony structures of the thorax are intact.
IMPRESSION: Interval improvement in left base airspace disease and small left
pleural effusion.

## 2017-08-03 LAB — OB RESULTS CONSOLE ANTIBODY SCREEN: ANTIBODY SCREEN: NEGATIVE

## 2017-08-03 LAB — OB RESULTS CONSOLE ABO/RH: RH Type: POSITIVE

## 2017-08-03 LAB — OB RESULTS CONSOLE RUBELLA ANTIBODY, IGM: RUBELLA: IMMUNE

## 2017-08-03 LAB — OB RESULTS CONSOLE GC/CHLAMYDIA
Chlamydia: NEGATIVE
Gonorrhea: NEGATIVE

## 2017-08-03 LAB — OB RESULTS CONSOLE HIV ANTIBODY (ROUTINE TESTING): HIV: NONREACTIVE

## 2017-08-03 LAB — OB RESULTS CONSOLE HEPATITIS B SURFACE ANTIGEN: HEP B S AG: NEGATIVE

## 2017-08-03 LAB — OB RESULTS CONSOLE RPR: RPR: NONREACTIVE

## 2017-08-25 ENCOUNTER — Other Ambulatory Visit: Payer: Self-pay | Admitting: Family

## 2017-08-25 DIAGNOSIS — D649 Anemia, unspecified: Secondary | ICD-10-CM

## 2017-08-25 DIAGNOSIS — D563 Thalassemia minor: Secondary | ICD-10-CM

## 2017-08-26 ENCOUNTER — Inpatient Hospital Stay: Payer: Medicaid Other

## 2017-08-26 ENCOUNTER — Inpatient Hospital Stay: Payer: Medicaid Other | Attending: Family | Admitting: Family

## 2017-08-26 ENCOUNTER — Other Ambulatory Visit: Payer: Self-pay

## 2017-08-26 VITALS — BP 99/52 | HR 88 | Temp 98.3°F | Ht 59.0 in | Wt 132.0 lb

## 2017-08-26 DIAGNOSIS — D563 Thalassemia minor: Secondary | ICD-10-CM | POA: Insufficient documentation

## 2017-08-26 DIAGNOSIS — D500 Iron deficiency anemia secondary to blood loss (chronic): Secondary | ICD-10-CM

## 2017-08-26 DIAGNOSIS — Z3A12 12 weeks gestation of pregnancy: Secondary | ICD-10-CM | POA: Diagnosis not present

## 2017-08-26 DIAGNOSIS — O99011 Anemia complicating pregnancy, first trimester: Secondary | ICD-10-CM | POA: Diagnosis not present

## 2017-08-26 DIAGNOSIS — D509 Iron deficiency anemia, unspecified: Secondary | ICD-10-CM | POA: Diagnosis present

## 2017-08-26 DIAGNOSIS — D649 Anemia, unspecified: Secondary | ICD-10-CM

## 2017-08-26 LAB — CMP (CANCER CENTER ONLY)
ALT: 14 U/L (ref 10–47)
ANION GAP: 11 (ref 5–15)
AST: 18 U/L (ref 11–38)
Albumin: 3.5 g/dL (ref 3.5–5.0)
Alkaline Phosphatase: 51 U/L (ref 26–84)
BUN: 8 mg/dL (ref 7–22)
CALCIUM: 9.4 mg/dL (ref 8.0–10.3)
CO2: 24 mmol/L (ref 18–33)
Chloride: 103 mmol/L (ref 98–108)
Creatinine: 0.6 mg/dL (ref 0.60–1.20)
Glucose, Bld: 85 mg/dL (ref 73–118)
POTASSIUM: 4.1 mmol/L (ref 3.3–4.7)
Sodium: 138 mmol/L (ref 128–145)
Total Bilirubin: 0.8 mg/dL (ref 0.2–1.6)
Total Protein: 7.3 g/dL (ref 6.4–8.1)

## 2017-08-26 LAB — CBC WITH DIFFERENTIAL (CANCER CENTER ONLY)
Basophils Absolute: 0 10*3/uL (ref 0.0–0.1)
Basophils Relative: 0 %
EOS PCT: 1 %
Eosinophils Absolute: 0.1 10*3/uL (ref 0.0–0.5)
HEMATOCRIT: 25.9 % — AB (ref 34.8–46.6)
Hemoglobin: 8.7 g/dL — ABNORMAL LOW (ref 11.6–15.9)
LYMPHS PCT: 20 %
Lymphs Abs: 2.3 10*3/uL (ref 0.9–3.3)
MCH: 19.9 pg — ABNORMAL LOW (ref 26.0–34.0)
MCHC: 33.6 g/dL (ref 32.0–36.0)
MCV: 59.3 fL — AB (ref 81.0–101.0)
MONO ABS: 0.8 10*3/uL (ref 0.1–0.9)
Monocytes Relative: 7 %
Neutro Abs: 8.3 10*3/uL — ABNORMAL HIGH (ref 1.5–6.5)
Neutrophils Relative %: 72 %
Platelet Count: 374 10*3/uL (ref 145–400)
RBC: 4.37 MIL/uL (ref 3.70–5.32)
RDW: 16.5 % — ABNORMAL HIGH (ref 11.1–15.7)
WBC Count: 11.5 10*3/uL — ABNORMAL HIGH (ref 3.9–10.0)

## 2017-08-26 LAB — SAVE SMEAR

## 2017-08-26 MED ORDER — FOLIC ACID 1 MG PO TABS: 1.0000 mg | ORAL_TABLET | Freq: Every day | ORAL | 11 refills | Status: DC

## 2017-08-26 NOTE — Progress Notes (Signed)
Hematology/Oncology Consultation   Name: Hayley Herring      MRN: [id number]    Location: Room/bed info not found  Date: 08/26/2017 Time:4:13 PM   REFERRING PHYSICIAN: Donnel Saxon, CNM  REASON FOR CONSULT: Beta Thalassemia trait   DIAGNOSIS:  Beta thalassemia trait Iron deficiency anemia   HISTORY OF PRESENT ILLNESS: Hayley Herring is a very pleasant 27 yo female with the beta thalassemia trait and history of iron deficiency anemia.  She is [redacted] weeks pregnant. Her due date is March 08, 2018. She is taking a prenatal vitamin with folic acid in it.  Hgb is 8.7 with an MCV of 59.  She is symptomatic with dizziness, fatigue, weakness and occasional headache.  She has had nausea for the last 3 weeks of her pregnancy.  She was given IV iron about 7 years ago and states that she had a reaction.  She states that she had an IUD in until January. Her cycle after that, prior to pregnancy, was regular and not heavy.  She has had no issue with bleeding, no bruising or petechiae.  She has a 45 yo daughter and had a C section without any complications. She developed preeclampsia for a couple weeks after.  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 This was stopped once she found out she was pregnant.  Her father has beta thalassemia minor.  She has had no issue with infections. No fever, chills, n/v, cough, rash, dizziness, SOB, chest pain, palpitations, abdominal pain or changes in bowel or bladder habits.  She has some constipation at times.  No swelling or tenderness in her extremities. She will have numbness and tingling in her fingertips at times in bed. This sounds positional.  She has a good appetite and does eat meat. She is hydrating well. Her weight is stable.  She does not smoke or drink alcohol.  She is originally from [place] and moved to the [place] 18 years ago.   ROS: All other 10 point review of systems  is negative.   PAST MEDICAL HISTORY:   Past Medical History:  Diagnosis Date  . Beta thalassemia minor   . Hidradenitis suppurativa     ALLERGIES: Allergies  Allergen Reactions  . Iron     Had a reaction when she was receiving an iron transfusion      MEDICATIONS:  Current Outpatient Medications on File Prior to Visit  Medication Sig Dispense Refill  . ferrous sulfate 325 (65 FE) MG tablet Take 325 mg by mouth daily with breakfast.    . ibuprofen (ADVIL,MOTRIN) 600 MG tablet Take 1 tablet (600 mg total) by mouth every 6 (six) hours. (Patient not taking: Reported on 08/26/2017) 30 tablet 1  . labetalol (NORMODYNE) 200 MG tablet Take 1 tablet (200 mg total) by mouth 2 (two) times daily. (Patient not taking: Reported on 08/26/2017) 60 tablet 1  . oxyCODONE-acetaminophen (PERCOCET/ROXICET) 5-325 MG per tablet Take 1 tablet by mouth every 4 (four) hours as needed (for pain scale less than 7). (Patient not taking: Reported on 12/20/2013) 30 tablet 0  . Prenatal Vit-Fe Fumarate-FA (PRENATAL MULTIVITAMIN) TABS tablet Take 1 tablet by mouth daily at 12 noon.     No current facility-administered medications on file prior to visit.      PAST SURGICAL HISTORY Past Surgical History:  Procedure Laterality Date  . CESAREAN SECTION N/A 12/16/2013   Procedure: CESAREAN SECTION;  Surgeon: Ena Dawley, MD;  Location: [facility];  Service: Obstetrics;  Laterality: N/A;  . SKIN GRAFT      FAMILY HISTORY: No family history on file.  SOCIAL HISTORY:  reports that she has never smoked. She does not have any smokeless tobacco history on file. She reports that she does not drink alcohol or use drugs.  PERFORMANCE STATUS: The patient's performance status is 1 - Symptomatic but completely ambulatory  PHYSICAL EXAM: Most Recent Vital Signs: Blood pressure (!) 99/52, pulse 88, temperature 98.3 F (36.8 C), temperature source Oral, height '4\' 11"'$  (1.499 m), weight 132 lb (59.9 kg), unknown if currently  breastfeeding. BP (!) 99/52 (BP Location: Left Arm, Patient Position: Sitting)   Pulse 88   Temp 98.3 F (36.8 C) (Oral)   Ht '4\' 11"'$  (1.499 m)   Wt 132 lb (59.9 kg)   BMI 26.66 kg/m   General Appearance:    Alert, cooperative, no distress, appears stated age  Head:    Normocephalic, without obvious abnormality, atraumatic  Eyes:    PERRL, conjunctiva/corneas clear, EOM's intact, fundi    benign, both eyes        Throat:   Lips, mucosa, and tongue normal; teeth and gums normal  Neck:   Supple, symmetrical, trachea midline, no adenopathy;    thyroid:  no enlargement/tenderness/nodules; no carotid   bruit or JVD  Back:     Symmetric, no curvature, ROM normal, no CVA tenderness  Lungs:     Clear to auscultation bilaterally, respirations unlabored  Chest Wall:    No tenderness or deformity   Heart:    Regular rate and rhythm, S1 and S2 normal, no murmur, rub   or gallop     Abdomen:     Soft, non-tender, bowel sounds active all four quadrants,    no masses, no organomegaly        Extremities:   Extremities normal, atraumatic, no cyanosis or edema  Pulses:   2+ and symmetric all extremities  Skin:   Skin color, texture, turgor normal, no rashes or lesions  Lymph nodes:   Cervical, supraclavicular, and axillary nodes normal  Neurologic:   CNII-XII intact, normal strength, sensation and reflexes    throughout    LABORATORY DATA:  Results for orders placed or performed in visit on 08/26/17 (from the past 48 hour(s))  CBC with Differential (Cancer Center Only)     Status: Abnormal   Collection Time: 08/26/17  3:14 PM  Result Value Ref Range   WBC Count 11.5 (H) 3.9 - 10.0 K/uL   RBC 4.37 3.70 - 5.32 MIL/uL   Hemoglobin 8.7 (L) 11.6 - 15.9 g/dL   HCT 25.9 (L) 34.8 - 46.6 %   MCV 59.3 (L) 81.0 - 101.0 fL   MCH 19.9 (L) 26.0 - 34.0 pg   MCHC 33.6 32.0 - 36.0 g/dL   RDW 16.5 (H) 11.1 - 15.7 %   Platelet Count 374 145 - 400 K/uL   Neutrophils Relative % 72 %   Neutro Abs 8.3 (H)  1.5 - 6.5 K/uL   Lymphocytes Relative 20 %   Lymphs Abs 2.3 0.9 - 3.3 K/uL   Monocytes Relative 7 %   Monocytes Absolute 0.8 0.1 - 0.9 K/uL   Eosinophils Relative 1 %   Eosinophils Absolute 0.1 0.0 - 0.5 K/uL   Basophils Relative 0 %   Basophils Absolute 0.0 0.0 - 0.1 K/uL    Comment: Performed at [facility] at [facility]  [address], [address], [place], [place] [address]  Save smear     Status: None   Collection Time: 08/26/17  3:14 PM  Result Value Ref Range   Smear Review SMEAR STAINED AND AVAILABLE FOR REVIEW     Comment: Performed at [facility] Lab at [facility], [address] [place], [place] [address]  [place] ([facility] only)     Status: None   Collection Time: 08/26/17  3:14 PM  Result Value Ref Range   Sodium 138 128 - 145 mmol/L   Potassium 4.1 3.3 - 4.7 mmol/L   Chloride 103 98 - 108 mmol/L   CO2 24 18 - 33 mmol/L   Glucose, Bld 85 73 - 118 mg/dL   BUN 8 7 - 22 mg/dL   Creatinine 0.60 0.60 - 1.20 mg/dL   Calcium 9.4 8.0 - 10.3 mg/dL   Total Protein 7.3 6.4 - 8.1 g/dL   Albumin 3.5 3.5 - 5.0 g/dL   AST 18 11 - 38 U/L   ALT 14 10 - 47 U/L   Alkaline Phosphatase 51 26 - 84 U/L   Total Bilirubin 0.8 0.2 - 1.6 mg/dL   Anion gap 11 5 - 15    Comment: Performed at [facility] at [facility], [address], [place], [place] [address]      RADIOGRAPHY: No results found.     PATHOLOGY: None  ASSESSMENT/PLAN: Ms. Dutkiewicz is a very pleasant 27 yo female with the beta thalassemia trait and history of iron deficiency anemia. She is [redacted] weeks pregnant and symptomatic with her Hgb of 8.7, MCV is 59.  We will get her on to folic acid 1 mg PO daily.  She states that she reacted to iron dextran 7 years ago so we will give her Injectafer if needed. Iron studies are pending.  Once we have her results, we will get her scheduled for infusions at the appropriate facility and follow-up.   All questions were  answered and she is in agreement with the plan. She will contact our office with any questions or concerns. We can certainly see her sooner if need be.  She was discussed with and also seen by Dr. Marin Olp and he is in agreement with the aforementioned.   Hayley Herring      Addendum: I saw and examined the patient with Judson Roch.  I agree with the above assessment.  By her blood smear, I have to suspect that she is iron deficient.  I saw quite a few hand mirror cells.  She had microcytic red blood cells.  I saw very few target cells.  She has some schistocytes.  The white blood cells and platelets appeared normal.  I believe that she probably needs a little bit more folic acid.  I would give her 1 mg of folic acid daily.  We will have to see what her reticulocyte count is.    We will have to see what her iron levels are.  If she is iron deficient, which I think she is, we will have to get her over to either women's hospital or the main cancer center for IV iron.  It sounds like she had a reaction to iron dextran several years ago.  I try to reassure her that we do not use that any longer and that the newer iron preparations are very safe with a minimal risk of reaction.  We spent 45 minutes  with her.  All the time was spent face-to-face.  Given that she is pregnant, this is a complicated situation that we had to spend some time with.  Again, we will see what her iron levels show.  I do not see where she needs a bone marrow biopsy.  We will see what her hemoglobin electrophoresis looks like.  We will plan to see her back probably in about 4 weeks or so.  Lattie Haw, MD

## 2017-08-27 ENCOUNTER — Other Ambulatory Visit: Payer: Self-pay | Admitting: Family

## 2017-08-27 LAB — RETICULOCYTES
RBC.: 4.49 MIL/uL (ref 3.70–5.45)
Retic Count, Absolute: 76.3 10*3/uL (ref 33.7–90.7)
Retic Ct Pct: 1.7 % (ref 0.7–2.1)

## 2017-08-27 LAB — HEMOGLOBINOPATHY EVALUATION
HGB S QUANTITAION: 0 %
Hgb A2 Quant: 4.9 % — ABNORMAL HIGH (ref 1.8–3.2)
Hgb A: 93 % — ABNORMAL LOW (ref 96.4–98.8)
Hgb C: 0 %
Hgb F Quant: 2.1 % — ABNORMAL HIGH (ref 0.0–2.0)
Hgb Variant: 0 %

## 2017-08-27 LAB — FERRITIN: FERRITIN: 78 ng/mL (ref 11–307)

## 2017-08-27 LAB — IRON AND TIBC
Iron: 66 ug/dL (ref 41–142)
Saturation Ratios: 20 % — ABNORMAL LOW (ref 21–57)
TIBC: 324 ug/dL (ref 236–444)
UIBC: 258 ug/dL

## 2017-08-28 ENCOUNTER — Other Ambulatory Visit: Payer: Self-pay | Admitting: Family

## 2017-09-07 ENCOUNTER — Telehealth: Payer: Self-pay | Admitting: Hematology & Oncology

## 2017-09-07 NOTE — Telephone Encounter (Signed)
Spoke with pt to schedule iv iron at [facility] hospital. Pt stated she is feeling better and want to hold off on getting the iron at this time. Gave patient the number [phone] to call RN at [facility] hospital to schedule appointment when ready

## 2017-09-14 LAB — ALPHA-THALASSEMIA GENOTYPR

## 2017-09-15 ENCOUNTER — Other Ambulatory Visit: Payer: Self-pay | Admitting: Family

## 2017-09-28 ENCOUNTER — Other Ambulatory Visit: Payer: Self-pay

## 2017-09-28 ENCOUNTER — Encounter (HOSPITAL_BASED_OUTPATIENT_CLINIC_OR_DEPARTMENT_OTHER): Payer: Self-pay | Admitting: Emergency Medicine

## 2017-09-28 ENCOUNTER — Emergency Department (HOSPITAL_BASED_OUTPATIENT_CLINIC_OR_DEPARTMENT_OTHER)
Admission: EM | Admit: 2017-09-28 | Discharge: 2017-09-28 | Disposition: A | Discharge status: Home / Self Care | Payer: Medicaid Other | Attending: Emergency Medicine | Admitting: Emergency Medicine

## 2017-09-28 DIAGNOSIS — Z5321 Procedure and treatment not carried out due to patient leaving prior to being seen by health care provider: Secondary | ICD-10-CM | POA: Diagnosis not present

## 2017-09-28 DIAGNOSIS — R42 Dizziness and giddiness: Secondary | ICD-10-CM | POA: Insufficient documentation

## 2017-09-28 NOTE — ED Triage Notes (Signed)
Patient states that she is [redacted] weeks pregnant and has low iron. She is dizzy on a daily basis. Today she reports that she is more dizzy today. Patient reports that she is more dizzy when she stands up - patient reports that she has preeclampsia

## 2017-09-30 NOTE — ED Notes (Signed)
Follow up call made  No answer  09/30/17  0854  s Zain Lankford rn

## 2017-10-08 ENCOUNTER — Inpatient Hospital Stay: Payer: Medicaid Other | Attending: Family | Admitting: Family

## 2017-10-08 ENCOUNTER — Other Ambulatory Visit: Payer: Medicaid Other

## 2018-01-04 ENCOUNTER — Telehealth: Payer: Self-pay | Admitting: Family

## 2018-01-04 ENCOUNTER — Encounter: Payer: Self-pay | Admitting: Family

## 2018-01-04 NOTE — Telephone Encounter (Signed)
lmom for pt to return call to office to set lab/ov appt per Judson Roch

## 2018-01-19 ENCOUNTER — Other Ambulatory Visit: Payer: Self-pay

## 2018-01-19 ENCOUNTER — Inpatient Hospital Stay: Payer: Medicaid Other

## 2018-01-19 ENCOUNTER — Inpatient Hospital Stay: Payer: Medicaid Other | Attending: Family | Admitting: Family

## 2018-01-19 VITALS — BP 104/60 | HR 84 | Temp 98.5°F | Resp 18 | Wt 154.5 lb

## 2018-01-19 DIAGNOSIS — D509 Iron deficiency anemia, unspecified: Secondary | ICD-10-CM | POA: Diagnosis not present

## 2018-01-19 DIAGNOSIS — D563 Thalassemia minor: Secondary | ICD-10-CM

## 2018-01-19 DIAGNOSIS — O99013 Anemia complicating pregnancy, third trimester: Secondary | ICD-10-CM | POA: Diagnosis not present

## 2018-01-19 DIAGNOSIS — D500 Iron deficiency anemia secondary to blood loss (chronic): Secondary | ICD-10-CM

## 2018-01-19 LAB — CBC WITH DIFFERENTIAL (CANCER CENTER ONLY)
Abs Immature Granulocytes: 0.15 10*3/uL — ABNORMAL HIGH (ref 0.00–0.07)
BASOS PCT: 0 %
Basophils Absolute: 0 10*3/uL (ref 0.0–0.1)
EOS ABS: 0.2 10*3/uL (ref 0.0–0.5)
Eosinophils Relative: 1 %
HCT: 28.2 % — ABNORMAL LOW (ref 36.0–46.0)
Hemoglobin: 8.6 g/dL — ABNORMAL LOW (ref 12.0–15.0)
IMMATURE GRANULOCYTES: 1 %
Lymphocytes Relative: 25 %
Lymphs Abs: 3 10*3/uL (ref 0.7–4.0)
MCH: 20.6 pg — ABNORMAL LOW (ref 26.0–34.0)
MCHC: 30.5 g/dL (ref 30.0–36.0)
MCV: 67.5 fL — AB (ref 80.0–100.0)
MONOS PCT: 5 %
Monocytes Absolute: 0.6 10*3/uL (ref 0.1–1.0)
NEUTROS PCT: 68 %
NRBC: 1.5 % — AB (ref 0.0–0.2)
Neutro Abs: 8.3 10*3/uL — ABNORMAL HIGH (ref 1.7–7.7)
Platelet Count: 253 10*3/uL (ref 150–400)
RBC: 4.18 MIL/uL (ref 3.87–5.11)
RDW: 17.2 % — AB (ref 11.5–15.5)
WBC Count: 12.3 10*3/uL — ABNORMAL HIGH (ref 4.0–10.5)

## 2018-01-19 LAB — CMP (CANCER CENTER ONLY)
ALT: 11 U/L (ref 0–44)
ANION GAP: 6 (ref 5–15)
AST: 18 U/L (ref 15–41)
Albumin: 3.5 g/dL (ref 3.5–5.0)
Alkaline Phosphatase: 78 U/L (ref 38–126)
BILIRUBIN TOTAL: 0.5 mg/dL (ref 0.3–1.2)
BUN: 9 mg/dL (ref 6–20)
CO2: 24 mmol/L (ref 22–32)
Calcium: 8.6 mg/dL — ABNORMAL LOW (ref 8.9–10.3)
Chloride: 106 mmol/L (ref 98–111)
Creatinine: 0.48 mg/dL (ref 0.44–1.00)
Glucose, Bld: 84 mg/dL (ref 70–99)
POTASSIUM: 4.9 mmol/L (ref 3.5–5.1)
Sodium: 136 mmol/L (ref 135–145)
TOTAL PROTEIN: 6.5 g/dL (ref 6.5–8.1)

## 2018-01-19 LAB — RETICULOCYTES
IMMATURE RETIC FRACT: 27.7 % — AB (ref 2.3–15.9)
RBC.: 4.18 MIL/uL (ref 3.87–5.11)
RETIC COUNT ABSOLUTE: 97.8 10*3/uL (ref 19.0–186.0)
RETIC CT PCT: 2.3 % (ref 0.4–3.1)

## 2018-01-19 NOTE — Progress Notes (Signed)
Hematology and Oncology Follow Up Visit  Hayley Herring [id number] 1991-01-07 27 y.o. 01/19/2018   Principle Diagnosis:  Beta thalassemia trait Iron deficiency anemia   Current Therapy:   Folic acid 1 mg PO daiy.    Interim History: Hayley Herring is here today for follow-up. She is doing well and is now [redacted] weeks pregnant. Due date is March 08, 2018. She is doing well and has no complaints at this time.  She verbalized that she is taking her folic acid daily as prescribed. Hgb is 8.6, MCV 67.5.   She has ad no contractions. No episodes of bleeding, no bruising or petechiae.  No fever, chills, n/v, cough, rash, dizziness, SOB, chest pain, palpitations, abdominal pain or changes in bowel or bladder habits.  No swelling, tenderness, numbness or tingling in her extremities at this time. No c/o joint aches or pains with RA. She is taking a baby aspirin daily.  No lymphadenopathy noted on exam.  She has maintained a good appetite and is staying well hydrated. Her weight is stable.   ECOG Performance Status: 1 - Symptomatic but completely ambulatory  Medications:  Allergies as of 01/19/2018      Reactions   Iron    Had a reaction when she was receiving an iron transfusion      Medication List        Accurate as of 01/19/18  3:28 PM. Always use your most recent med list.          aspirin EC 81 MG tablet Take 81 mg by mouth daily.   folic acid 1 MG tablet Commonly known as:  FOLVITE Take 1 tablet (1 mg total) by mouth daily.   prenatal multivitamin Tabs tablet Take 1 tablet by mouth daily at 12 noon.       Allergies:  Allergies  Allergen Reactions  . Iron     Had a reaction when she was receiving an iron transfusion    Past Medical History, Surgical history, Social history, and Family History were reviewed and updated.  Review of Systems: All other 10 point review of systems is negative.   Physical Exam:  weight is 154 lb 8 oz (70.1 kg). Her oral temperature is 98.5  F (36.9 C). Her blood pressure is 104/60 and her pulse is 84. Her respiration is 18 and oxygen saturation is 100%.   Wt Readings from Last 3 Encounters:  01/19/18 154 lb 8 oz (70.1 kg)  08/26/17 132 lb (59.9 kg)  12/22/13 173 lb 6.4 oz (78.7 kg)    Ocular: Sclerae unicteric, pupils equal, round and reactive to light Ear-nose-throat: Oropharynx clear, dentition fair Lymphatic: No cervical, supraclavicular or axillary adenopathy Lungs no rales or rhonchi, good excursion bilaterally Heart regular rate and rhythm, no murmur appreciated Abd soft, nontender, positive bowel sounds, no liver or spleen tip palpated on exam, no fluid wave MSK no focal spinal tenderness, no joint edema Neuro: non-focal, well-oriented, appropriate affect Breasts: Deferred   Lab Results  Component Value Date   WBC 12.3 (H) 01/19/2018   HGB 8.6 (L) 01/19/2018   HCT 28.2 (L) 01/19/2018   MCV 67.5 (L) 01/19/2018   PLT 253 01/19/2018   Lab Results  Component Value Date   FERRITIN 78 08/26/2017   IRON 66 08/26/2017   TIBC 324 08/26/2017   UIBC 258 08/26/2017   IRONPCTSAT 20 (L) 08/26/2017   Lab Results  Component Value Date   RETICCTPCT 2.3 01/19/2018   RBC 4.18 01/19/2018   RBC 4.18  01/19/2018   No results found for: KPAFRELGTCHN, LAMBDASER, KAPLAMBRATIO No results found for: IGGSERUM, IGA, IGMSERUM No results found for: Ronnald Ramp, A1GS, A2GS, Tillman Sers, SPEI   Chemistry      Component Value Date/Time   NA 138 08/26/2017 1514   K 4.1 08/26/2017 1514   CL 103 08/26/2017 1514   CO2 24 08/26/2017 1514   BUN 8 08/26/2017 1514   CREATININE 0.60 08/26/2017 1514      Component Value Date/Time   CALCIUM 9.4 08/26/2017 1514   ALKPHOS 51 08/26/2017 1514   AST 18 08/26/2017 1514   ALT 14 08/26/2017 1514   BILITOT 0.8 08/26/2017 1514      Impression and Plan: Hayley Herring is a very pleasant 27 yo female with beta thalassemia trait and history of iron deficiency  anemia. She is currently taking a prenatal vitamin and folic acid 1 mg PO daily. She is doing well and has no complaints at this time. She is due March 08, 2018. Her first child was delivered by C-section. She is hoping to give birth naturally this time.  Her Hgb is now up to 8.6 with an MCV of 67.5. She will continue her same regimen with folic acid.  We will plan to see her back 11-12 weeks.  She will contact our office with any questions or concerns. We can certainly see her sooner if need be.     Laverna Peace, NP 12/3/20193:28 PM

## 2018-01-20 LAB — IRON AND TIBC
IRON: 102 ug/dL (ref 41–142)
SATURATION RATIOS: 22 % (ref 21–57)
TIBC: 465 ug/dL — AB (ref 236–444)
UIBC: 364 ug/dL (ref 120–384)

## 2018-01-20 LAB — FERRITIN: Ferritin: 26 ng/mL (ref 11–307)

## 2018-02-18 ENCOUNTER — Encounter (HOSPITAL_COMMUNITY): Payer: Self-pay | Admitting: *Deleted

## 2018-02-19 ENCOUNTER — Other Ambulatory Visit: Payer: Self-pay | Admitting: Obstetrics and Gynecology

## 2018-02-26 ENCOUNTER — Encounter (HOSPITAL_COMMUNITY)
Admission: RE | Admit: 2018-02-26 | Discharge: 2018-02-26 | Disposition: A | Discharge status: Home / Self Care | Payer: Medicaid Other | Source: Ambulatory Visit | Attending: Obstetrics and Gynecology | Admitting: Obstetrics and Gynecology

## 2018-02-26 DIAGNOSIS — Z01812 Encounter for preprocedural laboratory examination: Secondary | ICD-10-CM | POA: Insufficient documentation

## 2018-02-26 HISTORY — DX: Supervision of pregnancy with other poor reproductive or obstetric history, unspecified trimester: O09.299

## 2018-02-26 LAB — TYPE AND SCREEN
ABO/RH(D): AB POS
Antibody Screen: NEGATIVE

## 2018-02-26 LAB — CBC
HEMATOCRIT: 29.3 % — AB (ref 36.0–46.0)
HEMOGLOBIN: 9.4 g/dL — AB (ref 12.0–15.0)
MCH: 21.1 pg — AB (ref 26.0–34.0)
MCHC: 32.1 g/dL (ref 30.0–36.0)
MCV: 65.7 fL — ABNORMAL LOW (ref 80.0–100.0)
Platelets: 291 10*3/uL (ref 150–400)
RBC: 4.46 MIL/uL (ref 3.87–5.11)
RDW: 16.6 % — ABNORMAL HIGH (ref 11.5–15.5)
WBC: 11.5 10*3/uL — ABNORMAL HIGH (ref 4.0–10.5)
nRBC: 2.9 % — ABNORMAL HIGH (ref 0.0–0.2)

## 2018-02-26 NOTE — Patient Instructions (Signed)
Hayley Herring  02/26/2018   Your procedure is scheduled on:  03/01/2018  Enter through the Main Entrance of [facility] up the phone at the desk and dial [phone]  Call this number if you have problems the morning of surgery:[phone]  Remember:   Do not eat food:(After Midnight) Desps de medianoche.  Do not drink clear liquids: (After Midnight) Desps de medianoche.  Take these medicines the morning of surgery with A SIP OF WATER: none   Do not wear jewelry, make-up or nail polish.  Do not wear lotions, powders, or perfumes. Do not wear deodorant.  Do not shave 48 hours prior to surgery.  Do not bring valuables to the hospital.  [facility] is not   responsible for any belongings or valuables brought to the hospital.  Contacts, dentures or bridgework may not be worn into surgery.  Leave suitcase in the car. After surgery it may be brought to your room.  For patients admitted to the hospital, checkout time is 11:00 AM the day of              discharge.    N/A   Please read over the following fact sheets that you were given:   Surgical Site Infection Prevention

## 2018-02-27 LAB — RPR: RPR: NONREACTIVE

## 2018-02-27 NOTE — H&P (Signed)
Hayley Herring is a 28 y.o. female, G2P1001, IUP at 39 weeks, presenting for Repeat CS for breech presentation. Pt endorse + Fm. Denies vaginal leakage. Denies vaginal bleeding. Denies feeling cxt's. 12/26 [place] growth EFW 6.3lbs, 38%. GBS+. Female Fetus. Negative Preeclampsia labs draw 12/11, PCR was 0.094. Pt currently denies HA, vision changes, nor RUQ pain.   Pregnancy Problems history of cesarean section (FTD, [facility], LTCS 2015, desires TOLAC, before breech dx: consent signed) on examination - short stature (4'11") Group B Streptococcus carrier (Planned CS) Anemia (Hgb 8.2 on NOB labs. 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 Folic acid 1 mg daily, hx rxn to IV Dextran. Hematology will plan infusions if needed and f/u.) pre-eclampsia (Severe, pp, 2015.) marginal insertion of umbilical cord (Growth q 4 weeks from 28 weeks.) hidradenitis suppurativa (reoccurring) breech presentation (Noted from 28 weeks, still at 37 weeks, plans repeat C/S.) beta Thalassemia (minor. FOB negative in prior testing.  Refer to hematology.) Medications aspirin folic acid Prenatal Plus (calcium carb)  Patient Active Problem List   Diagnosis Date Noted  . Single delivery by C-section 03/01/2018  . IDA (iron deficiency anemia) 08/26/2017  . Hypertension in pregnancy, preeclampsia, severe, delivered/postpartum 12/20/2013  . Overweight 12/20/2013  . Short stature 12/20/2013  . Cesarean delivery delivered 12/16/2013  . Hydradenitis 12/15/2013  . Thalassemia trait, beta--Hgb 8.9 on 10/03/13 10/13/2013   Medications Prior to Admission  Medication Sig Dispense Refill Last Dose  . aspirin EC 81 MG tablet Take 81 mg by mouth daily.   02/28/2018 at 2230  . folic acid (FOLVITE) 1 MG tablet Take 1 tablet (1 mg total) by mouth daily. 30 tablet 11 02/28/2018 at 2230  . Prenatal Vit-Fe Fumarate-FA (PRENATAL MULTIVITAMIN) TABS tablet Take 1 tablet by mouth daily at 12 noon.   02/28/2018 at 2230    Past Medical  History:  Diagnosis Date  . Beta thalassemia minor   . Hidradenitis suppurativa   . History of pre-eclampsia in prior pregnancy, currently pregnant      No current facility-administered medications on file prior to encounter.    Current Outpatient Medications on File Prior to Encounter  Medication Sig Dispense Refill  . aspirin EC 81 MG tablet Take 81 mg by mouth daily.    . folic acid (FOLVITE) 1 MG tablet Take 1 tablet (1 mg total) by mouth daily. 30 tablet 11  . Prenatal Vit-Fe Fumarate-FA (PRENATAL MULTIVITAMIN) TABS tablet Take 1 tablet by mouth daily at 12 noon.       Allergies  Allergen Reactions  . Iron Shortness Of Breath    Had a reaction when she was receiving an iron transfusion    History of present pregnancy: Pt Info/Preference:  Screening/Consents:  Labs:   EDD: Estimated Date of Delivery: 03/08/18  Establised: No LMP recorded. Patient is pregnant.  Anatomy Scan: Date: 9/5 Placenta Location: anterior Genetic Screen: Panoroma: Declined AFP:  First Tri: Quad:  Office: [facility]            Md: [place] First PNV: 9.4 wg Blood Type --/--/AB POS (01/10 1005)  Language: english Last PNV: 38.1 wg Rhogam    Flu Vaccine:  UTD   Antibody NEG (01/10 1005)  TDaP vaccine UTD   GTT: Early: Normal Third Trimester: Normal  Feeding Plan: Breast BTL: No Rubella: Immune (06/17 0000)  Contraception: ??? VBAC: Desired consnet signed, but breech presentation  RPR: Non Reactive (01/10 1005)   Circumcision: N/A female   HBsAg: Negative (06/17 0000)  Pediatrician:  ???  HIV: Non-reactive (06/17 0000)   Prenatal Classes: No Additional [place]: 12/26 EFW 6.3lbs, 38% GBS:  (For PCN allergy, check sensitivities)       Chlamydia: Neg    MFM Referral/Consult:  GC: Neg  Support Person: Husband   PAP: 2015-normal per pt  Pain Management: spinal Neonatologist Referral:  Hgb Electrophoresis:  Beta thal trait  Birth Plan: none   Hgb NOB: 8.2    28W: 9.4  02/11/2018 growth scan:  OB History    Gravida    2   Para  1   Term  1   Preterm      AB      Living  1     SAB      TAB      Ectopic      Multiple      Live Births  1          Past Medical History:  Diagnosis Date  . Beta thalassemia minor   . Hidradenitis suppurativa   . History of pre-eclampsia in prior pregnancy, currently pregnant    Past Surgical History:  Procedure Laterality Date  . CESAREAN SECTION N/A 12/16/2013   Procedure: CESAREAN SECTION;  Surgeon: Ena Dawley, MD;  Location: [facility];  Service: Obstetrics;  Laterality: N/A;  . SKIN GRAFT     Family History: family history includes Diabetes in her mother. Social History:  reports that she has never smoked. She has never used smokeless tobacco. She reports that she does not drink alcohol or use drugs.   Prenatal Transfer Tool  Maternal Diabetes: No Genetic Screening: Normal Maternal Ultrasounds/Referrals: Normal Fetal Ultrasounds or other Referrals:  None Maternal Substance Abuse:  No Significant Maternal Medications:  None Significant Maternal Lab Results: Lab values include: Group B Strep positive  ROS:  Review of Systems  Constitutional: Negative.   HENT: Negative.   Eyes: Negative.   Respiratory: Negative.   Cardiovascular: Negative.   Gastrointestinal: Negative.   Genitourinary: Negative.   Musculoskeletal: Negative.   Skin: Negative.   Neurological: Negative.   Endo/Heme/Allergies: Negative.   Psychiatric/Behavioral: Negative.      Physical Exam: BP 111/71   Pulse 92   Temp 98.5 F (36.9 C) (Oral)   Resp 16   Ht 4\' 11"  (1.499 m)   Wt 72.7 kg   SpO2 100%   BMI 32.36 kg/m   Physical Exam  Constitutional: She is oriented to person, place, and time and well-developed, well-nourished, and in no distress.  HENT:  Head: Normocephalic and atraumatic.  Eyes: Pupils are equal, round, and reactive to light. Conjunctivae are normal.  Neck: Normal range of motion. Neck supple.  Cardiovascular: Normal rate and regular  rhythm.  Pulmonary/Chest: Effort normal and breath sounds normal.  Abdominal: Soft. Bowel sounds are normal.  Genitourinary:    Genitourinary Comments: Deferred pelvis exam, uterus gravida. Soft, non-tender   Musculoskeletal: Normal range of motion.  Neurological: She is alert and oriented to person, place, and time. She has normal reflexes. Gait normal.  No clonus   Skin: Skin is warm and dry.  Psychiatric: Affect normal.  Nursing note and vitals reviewed.   FHR: 145 UC:   none  Leopold's: Position Breech, EFW 7.5 via leopold's.  Bedside US revealed breech presentation.   Labs: No results found for this or any previous visit (from the past 24 hour(s)).  Imaging:  No results found.  MAU Course: Orders Placed This Encounter  Procedures  . Diet NPO time specified  .  Informed consent details: transcribe and obtain patient signature  . Initiate Pre-op Protocol  . Place and maintain sequential compression device  . Complete patient signature process for consent form  . Verify informed consent  . Instruct patient in turn, cough, deep breathe, and leg exercises  . Insert peripheral IV  . Admit to Inpatient (patient's expected length of stay will be greater than 2 midnights or inpatient only procedure)   Meds ordered this encounter  Medications  . ceFAZolin (ANCEF) IVPB 2g/100 mL premix    Order Specific Question:   Indication:    Answer:   Surgical Prophylaxis  . sodium citrate-citric acid (ORACIT) solution 30 mL  . lactated ringers infusion    Assessment/Plan: Hayley Herring is a 28 y.o. female, G2P1001, IUP at 39 weeks, presenting for Repeat CS for breech presentation. Pt endorse + Fm. Denies vaginal leakage. Denies vaginal bleeding. Denies feeling cxt's. 12/26 [place] growth EFW 6.3lbs, 38%. GBS+. Female Fetus. Negative Preeclampsia labs draw 12/11, PCR was 0.094. Pt currently denies HA, vision changes, nor RUQ pain.   Pregnancy Problems history of cesarean section (FTD,  [facility]/AVS, LTCS 2015, desires TOLAC, before breech dx: consent signed) on examination - short stature (4'11") Group B Streptococcus carrier (Planned CS) Anemia (Hgb 8.2 on NOB labs. 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 Folic acid 1 mg daily, hx rxn to IV Dextran. Hematology will plan infusions if needed and f/u.) pre-eclampsia (Severe, pp, 2015.) marginal insertion of umbilical cord (Growth q 4 weeks from 28 weeks.) hidradenitis suppurativa (reoccurring) breech presentation (Noted from 28 weeks, still at 37 weeks, plans repeat C/S.) beta Thalassemia (minor. FOB negative in prior testing.  Refer to hematology.)  Plan: Admit to [place] per consult with Dr Cletis Media Routine pre-op [facility] orders H/O PreE: monitor BP, Labs PRN Pain med/spinal epidural Anticipate Repeat CS  [facility] NP-C, CNM, MSN 03/01/2018, 7:00 AM

## 2018-02-28 NOTE — Progress Notes (Signed)
RN called  to notify provider for pre-op orders.

## 2018-03-01 ENCOUNTER — Encounter (HOSPITAL_COMMUNITY)
Admission: RE | Disposition: A | Discharge status: Home / Self Care | Payer: Self-pay | Source: Home / Self Care | Attending: Obstetrics & Gynecology

## 2018-03-01 ENCOUNTER — Inpatient Hospital Stay (HOSPITAL_COMMUNITY): Payer: Medicaid Other | Admitting: Anesthesiology

## 2018-03-01 ENCOUNTER — Encounter (HOSPITAL_COMMUNITY): Payer: Self-pay

## 2018-03-01 ENCOUNTER — Other Ambulatory Visit: Payer: Self-pay

## 2018-03-01 ENCOUNTER — Inpatient Hospital Stay (HOSPITAL_COMMUNITY)
Admission: RE | Admit: 2018-03-01 | Discharge: 2018-03-03 | DRG: 788 | Disposition: A | Discharge status: Home / Self Care | Payer: Medicaid Other | Attending: Obstetrics & Gynecology | Admitting: Obstetrics & Gynecology

## 2018-03-01 DIAGNOSIS — O9972 Diseases of the skin and subcutaneous tissue complicating childbirth: Secondary | ICD-10-CM | POA: Diagnosis present

## 2018-03-01 DIAGNOSIS — O34211 Maternal care for low transverse scar from previous cesarean delivery: Secondary | ICD-10-CM | POA: Diagnosis present

## 2018-03-01 DIAGNOSIS — Z3A39 39 weeks gestation of pregnancy: Secondary | ICD-10-CM | POA: Diagnosis not present

## 2018-03-01 DIAGNOSIS — O9902 Anemia complicating childbirth: Secondary | ICD-10-CM | POA: Diagnosis present

## 2018-03-01 DIAGNOSIS — D563 Thalassemia minor: Secondary | ICD-10-CM | POA: Diagnosis present

## 2018-03-01 DIAGNOSIS — O43123 Velamentous insertion of umbilical cord, third trimester: Secondary | ICD-10-CM | POA: Diagnosis present

## 2018-03-01 DIAGNOSIS — D509 Iron deficiency anemia, unspecified: Secondary | ICD-10-CM | POA: Diagnosis present

## 2018-03-01 DIAGNOSIS — Z7982 Long term (current) use of aspirin: Secondary | ICD-10-CM | POA: Diagnosis not present

## 2018-03-01 DIAGNOSIS — Z98891 History of uterine scar from previous surgery: Secondary | ICD-10-CM

## 2018-03-01 DIAGNOSIS — O9081 Anemia of the puerperium: Secondary | ICD-10-CM

## 2018-03-01 DIAGNOSIS — L732 Hidradenitis suppurativa: Secondary | ICD-10-CM | POA: Diagnosis present

## 2018-03-01 DIAGNOSIS — O99824 Streptococcus B carrier state complicating childbirth: Secondary | ICD-10-CM | POA: Diagnosis present

## 2018-03-01 DIAGNOSIS — O321XX0 Maternal care for breech presentation, not applicable or unspecified: Principal | ICD-10-CM | POA: Diagnosis present

## 2018-03-01 SURGERY — Surgical Case
Anesthesia: Spinal

## 2018-03-01 MED ORDER — FENTANYL CITRATE (PF) 100 MCG/2ML IJ SOLN
INTRAMUSCULAR | Status: DC | PRN
  Administered 2018-03-01: 85 ug via INTRAVENOUS
  Administered 2018-03-01: 15 ug via INTRATHECAL

## 2018-03-01 MED ORDER — DEXAMETHASONE SODIUM PHOSPHATE 4 MG/ML IJ SOLN
INTRAMUSCULAR | Status: DC | PRN
  Administered 2018-03-01: 4 mg via INTRAVENOUS

## 2018-03-01 MED ORDER — TETANUS-DIPHTH-ACELL PERTUSSIS 5-2.5-18.5 LF-MCG/0.5 IM SUSP: 0.5000 mL | Freq: Once | INTRAMUSCULAR | Status: DC

## 2018-03-01 MED ORDER — NALOXONE HCL 4 MG/10ML IJ SOLN
1.0000 ug/kg/h | INTRAVENOUS | Status: DC | PRN
  Filled 2018-03-01: qty 5

## 2018-03-01 MED ORDER — MENTHOL 3 MG MT LOZG: 1.0000 | LOZENGE | OROMUCOSAL | Status: DC | PRN

## 2018-03-01 MED ORDER — LACTATED RINGERS IV SOLN
INTRAVENOUS | Status: DC
  Administered 2018-03-01 – 2018-03-02 (×2): via INTRAVENOUS

## 2018-03-01 MED ORDER — DIPHENHYDRAMINE HCL 25 MG PO CAPS: 25.0000 mg | ORAL_CAPSULE | Freq: Four times a day (QID) | ORAL | Status: DC | PRN

## 2018-03-01 MED ORDER — OXYTOCIN 40 UNITS IN NORMAL SALINE INFUSION - SIMPLE MED: 2.5000 [IU]/h | INTRAVENOUS | Status: AC

## 2018-03-01 MED ORDER — MEPERIDINE HCL 25 MG/ML IJ SOLN: 6.2500 mg | INTRAMUSCULAR | Status: DC | PRN

## 2018-03-01 MED ORDER — OXYCODONE HCL 5 MG PO TABS: 5.0000 mg | ORAL_TABLET | Freq: Once | ORAL | Status: DC | PRN

## 2018-03-01 MED ORDER — SIMETHICONE 80 MG PO CHEW
80.0000 mg | CHEWABLE_TABLET | ORAL | Status: DC | PRN
  Administered 2018-03-01: 80 mg via ORAL
  Filled 2018-03-01: qty 1

## 2018-03-01 MED ORDER — ONDANSETRON HCL 4 MG/2ML IJ SOLN
INTRAMUSCULAR | Status: DC | PRN
  Administered 2018-03-01: 4 mg via INTRAVENOUS

## 2018-03-01 MED ORDER — NALBUPHINE HCL 10 MG/ML IJ SOLN: 5.0000 mg | INTRAMUSCULAR | Status: DC | PRN

## 2018-03-01 MED ORDER — PHENYLEPHRINE 8 MG IN D5W 100 ML (0.08MG/ML) PREMIX OPTIME
INJECTION | INTRAVENOUS | Status: DC | PRN
  Administered 2018-03-01: 60 ug/min via INTRAVENOUS

## 2018-03-01 MED ORDER — MORPHINE SULFATE (PF) 0.5 MG/ML IJ SOLN
INTRAMUSCULAR | Status: DC | PRN
  Administered 2018-03-01: .15 mg via INTRATHECAL

## 2018-03-01 MED ORDER — FENTANYL CITRATE (PF) 100 MCG/2ML IJ SOLN
INTRAMUSCULAR | Status: AC
  Filled 2018-03-01: qty 2

## 2018-03-01 MED ORDER — ONDANSETRON HCL 4 MG/2ML IJ SOLN: 4.0000 mg | Freq: Three times a day (TID) | INTRAMUSCULAR | Status: DC | PRN

## 2018-03-01 MED ORDER — IBUPROFEN 600 MG PO TABS
600.0000 mg | ORAL_TABLET | Freq: Four times a day (QID) | ORAL | Status: DC
  Administered 2018-03-01 – 2018-03-03 (×7): 600 mg via ORAL
  Filled 2018-03-01 (×7): qty 1

## 2018-03-01 MED ORDER — SOD CITRATE-CITRIC ACID 500-334 MG/5ML PO SOLN
30.0000 mL | ORAL | Status: AC
  Administered 2018-03-01: 30 mL via ORAL
  Filled 2018-03-01: qty 15

## 2018-03-01 MED ORDER — SODIUM CHLORIDE 0.9 % IR SOLN
Status: DC | PRN
  Administered 2018-03-01: 1

## 2018-03-01 MED ORDER — BUPIVACAINE HCL (PF) 0.5 % IJ SOLN
INTRAMUSCULAR | Status: AC
  Filled 2018-03-01: qty 30

## 2018-03-01 MED ORDER — SODIUM CHLORIDE 0.9% FLUSH: 3.0000 mL | INTRAVENOUS | Status: DC | PRN

## 2018-03-01 MED ORDER — OXYTOCIN 10 UNIT/ML IJ SOLN
INTRAVENOUS | Status: DC | PRN
  Administered 2018-03-01: 60 [IU] via INTRAVENOUS

## 2018-03-01 MED ORDER — BUPIVACAINE HCL (PF) 0.5 % IJ SOLN
INTRAMUSCULAR | Status: DC | PRN
  Administered 2018-03-01: 30 mL

## 2018-03-01 MED ORDER — ZOLPIDEM TARTRATE 5 MG PO TABS: 5.0000 mg | ORAL_TABLET | Freq: Every evening | ORAL | Status: DC | PRN

## 2018-03-01 MED ORDER — DEXAMETHASONE SODIUM PHOSPHATE 4 MG/ML IJ SOLN
INTRAMUSCULAR | Status: AC
  Filled 2018-03-01: qty 1

## 2018-03-01 MED ORDER — COCONUT OIL OIL
1.0000 "application " | TOPICAL_OIL | Status: DC | PRN
  Administered 2018-03-02: 1 via TOPICAL
  Filled 2018-03-01: qty 120

## 2018-03-01 MED ORDER — DIPHENHYDRAMINE HCL 50 MG/ML IJ SOLN: 12.5000 mg | INTRAMUSCULAR | Status: DC | PRN

## 2018-03-01 MED ORDER — BUPIVACAINE IN DEXTROSE 0.75-8.25 % IT SOLN
INTRATHECAL | Status: DC | PRN
  Administered 2018-03-01: 1.4 mL via INTRATHECAL

## 2018-03-01 MED ORDER — NALOXONE HCL 0.4 MG/ML IJ SOLN: 0.4000 mg | INTRAMUSCULAR | Status: DC | PRN

## 2018-03-01 MED ORDER — NALBUPHINE HCL 10 MG/ML IJ SOLN: 5.0000 mg | Freq: Once | INTRAMUSCULAR | Status: DC | PRN

## 2018-03-01 MED ORDER — ACETAMINOPHEN 500 MG PO TABS
1000.0000 mg | ORAL_TABLET | Freq: Four times a day (QID) | ORAL | Status: DC
  Administered 2018-03-01: 1000 mg via ORAL
  Filled 2018-03-01 (×2): qty 2

## 2018-03-01 MED ORDER — KETOROLAC TROMETHAMINE 30 MG/ML IJ SOLN
30.0000 mg | Freq: Once | INTRAMUSCULAR | Status: AC | PRN
  Administered 2018-03-01: 30 mg via INTRAVENOUS

## 2018-03-01 MED ORDER — SENNOSIDES-DOCUSATE SODIUM 8.6-50 MG PO TABS
2.0000 | ORAL_TABLET | ORAL | Status: DC
  Administered 2018-03-01 – 2018-03-02 (×2): 2 via ORAL
  Filled 2018-03-01 (×2): qty 2

## 2018-03-01 MED ORDER — ACETAMINOPHEN 325 MG PO TABS
650.0000 mg | ORAL_TABLET | Freq: Four times a day (QID) | ORAL | Status: DC | PRN
  Administered 2018-03-01 – 2018-03-03 (×4): 650 mg via ORAL
  Filled 2018-03-01 (×4): qty 2

## 2018-03-01 MED ORDER — LACTATED RINGERS IV SOLN
INTRAVENOUS | Status: DC
  Administered 2018-03-01: 19:00:00 via INTRAVENOUS

## 2018-03-01 MED ORDER — OXYCODONE HCL 5 MG/5ML PO SOLN: 5.0000 mg | Freq: Once | ORAL | Status: DC | PRN

## 2018-03-01 MED ORDER — HYDROMORPHONE HCL 1 MG/ML IJ SOLN: 0.2500 mg | INTRAMUSCULAR | Status: DC | PRN

## 2018-03-01 MED ORDER — WITCH HAZEL-GLYCERIN EX PADS: 1.0000 "application " | MEDICATED_PAD | CUTANEOUS | Status: DC | PRN

## 2018-03-01 MED ORDER — DIPHENHYDRAMINE HCL 25 MG PO CAPS
25.0000 mg | ORAL_CAPSULE | ORAL | Status: DC | PRN
  Filled 2018-03-01: qty 1

## 2018-03-01 MED ORDER — OXYCODONE HCL 5 MG PO TABS
5.0000 mg | ORAL_TABLET | ORAL | Status: DC | PRN
  Administered 2018-03-02 – 2018-03-03 (×3): 5 mg via ORAL
  Filled 2018-03-01 (×3): qty 1

## 2018-03-01 MED ORDER — SCOPOLAMINE 1 MG/3DAYS TD PT72
MEDICATED_PATCH | TRANSDERMAL | Status: AC
  Filled 2018-03-01: qty 1

## 2018-03-01 MED ORDER — PHENYLEPHRINE 8 MG IN D5W 100 ML (0.08MG/ML) PREMIX OPTIME
INJECTION | INTRAVENOUS | Status: AC
  Filled 2018-03-01: qty 100

## 2018-03-01 MED ORDER — OXYCODONE-ACETAMINOPHEN 5-325 MG PO TABS: 2.0000 | ORAL_TABLET | ORAL | Status: DC | PRN

## 2018-03-01 MED ORDER — ONDANSETRON HCL 4 MG/2ML IJ SOLN
INTRAMUSCULAR | Status: AC
  Filled 2018-03-01: qty 2

## 2018-03-01 MED ORDER — SIMETHICONE 80 MG PO CHEW
80.0000 mg | CHEWABLE_TABLET | ORAL | Status: DC
  Filled 2018-03-01: qty 1

## 2018-03-01 MED ORDER — KETOROLAC TROMETHAMINE 30 MG/ML IJ SOLN
INTRAMUSCULAR | Status: AC
  Filled 2018-03-01: qty 1

## 2018-03-01 MED ORDER — OXYTOCIN 10 UNIT/ML IJ SOLN
INTRAMUSCULAR | Status: AC
  Filled 2018-03-01: qty 4

## 2018-03-01 MED ORDER — PROMETHAZINE HCL 25 MG/ML IJ SOLN: 6.2500 mg | INTRAMUSCULAR | Status: DC | PRN

## 2018-03-01 MED ORDER — SIMETHICONE 80 MG PO CHEW
80.0000 mg | CHEWABLE_TABLET | Freq: Three times a day (TID) | ORAL | Status: DC
  Administered 2018-03-01 – 2018-03-03 (×5): 80 mg via ORAL
  Filled 2018-03-01 (×7): qty 1

## 2018-03-01 MED ORDER — MORPHINE SULFATE (PF) 0.5 MG/ML IJ SOLN
INTRAMUSCULAR | Status: AC
  Filled 2018-03-01: qty 10

## 2018-03-01 MED ORDER — SCOPOLAMINE 1 MG/3DAYS TD PT72
1.0000 | MEDICATED_PATCH | Freq: Once | TRANSDERMAL | Status: DC
  Administered 2018-03-01: 1.5 mg via TRANSDERMAL

## 2018-03-01 MED ORDER — CEFAZOLIN SODIUM-DEXTROSE 2-4 GM/100ML-% IV SOLN
2.0000 g | INTRAVENOUS | Status: AC
  Administered 2018-03-01: 2 g via INTRAVENOUS
  Filled 2018-03-01: qty 100

## 2018-03-01 MED ORDER — DIBUCAINE 1 % RE OINT: 1.0000 "application " | TOPICAL_OINTMENT | RECTAL | Status: DC | PRN

## 2018-03-01 SURGICAL SUPPLY — 42 items
BENZOIN TINCTURE PRP APPL 2/3 (GAUZE/BANDAGES/DRESSINGS) ×3 IMPLANT
CHLORAPREP W/TINT 26ML (MISCELLANEOUS) ×3 IMPLANT
CLAMP CORD UMBIL (MISCELLANEOUS) IMPLANT
CLOSURE WOUND 1/2 X4 (GAUZE/BANDAGES/DRESSINGS) ×1
CLOTH BEACON ORANGE TIMEOUT ST (SAFETY) ×3 IMPLANT
DRSG OPSITE POSTOP 4X10 (GAUZE/BANDAGES/DRESSINGS) ×3 IMPLANT
ELECT REM PT RETURN 9FT ADLT (ELECTROSURGICAL) ×3
ELECTRODE REM PT RTRN 9FT ADLT (ELECTROSURGICAL) ×1 IMPLANT
EXTRACTOR VACUUM BELL STYLE (SUCTIONS) IMPLANT
EXTRACTOR VACUUM KIWI (MISCELLANEOUS) IMPLANT
GAUZE SPONGE 4X4 12PLY STRL LF (GAUZE/BANDAGES/DRESSINGS) ×6 IMPLANT
GLOVE BIO SURGEON STRL SZ 6.5 (GLOVE) ×2 IMPLANT
GLOVE BIO SURGEONS STRL SZ 6.5 (GLOVE) ×1
GLOVE BIOGEL PI IND STRL 7.0 (GLOVE) ×2 IMPLANT
GLOVE BIOGEL PI INDICATOR 7.0 (GLOVE) ×4
GOWN STRL REUS W/TWL LRG LVL3 (GOWN DISPOSABLE) ×9 IMPLANT
HEMOSTAT ARISTA ABSORB 3G PWDR (MISCELLANEOUS) ×3 IMPLANT
KIT ABG SYR 3ML LUER SLIP (SYRINGE) IMPLANT
NEEDLE HYPO 22GX1.5 SAFETY (NEEDLE) IMPLANT
NEEDLE HYPO 25X5/8 SAFETYGLIDE (NEEDLE) IMPLANT
NS IRRIG 1000ML POUR BTL (IV SOLUTION) ×3 IMPLANT
PACK C SECTION WH (CUSTOM PROCEDURE TRAY) ×3 IMPLANT
PAD ABD 7.5X8 STRL (GAUZE/BANDAGES/DRESSINGS) ×3 IMPLANT
PAD OB MATERNITY 4.3X12.25 (PERSONAL CARE ITEMS) ×3 IMPLANT
PENCIL SMOKE EVAC W/HOLSTER (ELECTROSURGICAL) ×3 IMPLANT
RTRCTR C-SECT PINK 25CM LRG (MISCELLANEOUS) ×3 IMPLANT
SPONGE LAP 18X18 RF (DISPOSABLE) ×9 IMPLANT
STRIP CLOSURE SKIN 1/2X4 (GAUZE/BANDAGES/DRESSINGS) ×2 IMPLANT
SUT CHROMIC 0 CT 1 36 (SUTURE) ×3 IMPLANT
SUT CHROMIC 2 0 CT 1 (SUTURE) ×6 IMPLANT
SUT MNCRL 0 VIOLET CTX 36 (SUTURE) ×3 IMPLANT
SUT MONOCRYL 0 CTX 36 (SUTURE) ×6
SUT PDS AB 0 CTX 36 PDP370T (SUTURE) IMPLANT
SUT PLAIN 2 0 (SUTURE) ×2
SUT PLAIN ABS 2-0 CT1 27XMFL (SUTURE) ×1 IMPLANT
SUT VIC AB 0 CT1 36 (SUTURE) ×3 IMPLANT
SUT VIC AB 0 CTX 36 (SUTURE) ×4
SUT VIC AB 0 CTX36XBRD ANBCTRL (SUTURE) ×2 IMPLANT
SUT VIC AB 4-0 KS 27 (SUTURE) ×3 IMPLANT
SYR CONTROL 10ML LL (SYRINGE) IMPLANT
TOWEL OR 17X24 6PK STRL BLUE (TOWEL DISPOSABLE) ×3 IMPLANT
TRAY FOLEY W/BAG SLVR 14FR LF (SET/KITS/TRAYS/PACK) ×3 IMPLANT

## 2018-03-01 NOTE — Transfer of Care (Signed)
Immediate Anesthesia Transfer of Care Note  Patient: Hayley Herring  Procedure(s) Performed: REPEAT CESAREAN SECTION (N/A )  Patient Location: PACU  Anesthesia Type:Spinal  Level of Consciousness: awake, alert  and oriented  Airway & Oxygen Therapy: Patient Spontanous Breathing  Post-op Assessment: Report given to RN and Post -op Vital signs reviewed and stable  Post vital signs: Reviewed and stable HR 76, RR 18, SaO2 100%, BP 102/53  Last Vitals:  Vitals Value Taken Time  BP    Temp    Pulse 77 03/01/2018  9:20 AM  Resp 24 03/01/2018  9:20 AM  SpO2 100 % 03/01/2018  9:20 AM  Vitals shown include unvalidated device data.  Last Pain:  Vitals:   03/01/18 0553  TempSrc: Oral  PainSc: 0-No pain         Complications: No apparent anesthesia complications

## 2018-03-01 NOTE — Lactation Note (Signed)
This note was copied from a baby's chart. Lactation Consultation Note  Patient Name: Hayley Herring [id number] Date: 03/01/2018 Reason for consult: Initial assessment P2, 10 hour female infant. Per mom, she has medela DEBP at home. Per mom, she stopped breastfeeding her eldest daughter at 4 months due to developing postpartum  pre-eclampsia. Mom's goal is to breastfeed infant for one year. Mom was breastfeeding infant on right breast using the football hold position when [facility] entered the room. LC notice infant was latched on Mom's  nipple tip. Mom was agreeable to break latch and re-latch infant on breast , LC notice mom's  nipple was pinched when infant came off the breast. LC explained nipple should be rounded when coming off breast and not pinched or wedge for a good latch. LC ask mom to support her breast using C-hold hand position and wait until infant mouth is wide with tongue down " like biting into an apple." Per mom, she felt a difference when re-latching infant to breast and felt it was much better. Mom was still breastfeeding when [facility] left room , infant has been breastfeeding for 25 minutes.  Mom knows to breastfeed infant according hunger cues, 8 to 12 times within 24 hours including nights and not exceed 3 hours without breastfeeding infant. LC discussed I&O. Reviewed Baby & Me book's Breastfeeding Basics.  Mom knows to call Nurse or LC if she has any questions, concerns or needs further assistance with breastfeeding. Mom made aware of O/P services, breastfeeding support groups, community resources, and our phone # for post-discharge questions.  Maternal Data Formula Feeding for Exclusion: No Has patient been taught Hand Expression?: Yes Does the patient have breastfeeding experience prior to this delivery?: Yes  Feeding Feeding Type: Breast Fed  LATCH Score Latch: Grasps breast easily, tongue down, lips flanged, rhythmical sucking.  Audible Swallowing: A few with  stimulation  Type of Nipple: Everted at rest and after stimulation  Comfort (Breast/Nipple): Soft / non-tender  Hold (Positioning): Assistance needed to correctly position infant at breast and maintain latch.  LATCH Score: 8  Interventions Interventions: Breast feeding basics reviewed;Assisted with latch;Breast compression;Adjust position;Support pillows;Position options  Lactation Tools Discussed/Used WIC Program: No   Consult Status Consult Status: Follow-up Date: 03/02/18 Follow-up type: In-patient    Vicente Serene 03/01/2018, 7:17 PM

## 2018-03-01 NOTE — Anesthesia Procedure Notes (Signed)
Spinal  Patient location during procedure: OB End time: 03/01/2018 7:53 AM Staffing Anesthesiologist: Lynda Rainwater, MD Performed: anesthesiologist  Preanesthetic Checklist Completed: patient identified, surgical consent, pre-op evaluation, timeout performed, IV checked, risks and benefits discussed and monitors and equipment checked Spinal Block Patient position: sitting Prep: site prepped and draped and DuraPrep Patient monitoring: heart rate, cardiac monitor, continuous pulse ox and blood pressure Approach: midline Location: L3-4 Injection technique: single-shot Needle Needle type: Pencan  Needle gauge: 24 G Needle length: 10 cm Assessment Sensory level: T4

## 2018-03-01 NOTE — Progress Notes (Signed)
Patient requested to hold po meds, Tylenol 1000 mg, until she eats her food. Patient plans to order meal soon. Denies pain.

## 2018-03-01 NOTE — Consult Note (Signed)
Neonatology Note:   Attendance at C-section:  I was asked by Dr. Alwyn Pea to attend this repeat C/S at term for breech presentation. The mother is a G2P1, GBS + with good prenatal care. No labor, no chorio concerns.  Pregnancy complicated by pre-eclampsia.  ROM 0 hours before delivery, fluid clear. Infant vigorous with good spontaneous cry and tone. Needed only minimal bulb suctioning. Ap 8/9. Lungs clear to ausc in DR.To CN to care of Pediatrician.  Monia Sabal Katherina Mires, MD Neonatologist 03/01/2018, 9:28 AM

## 2018-03-01 NOTE — Anesthesia Preprocedure Evaluation (Signed)
Anesthesia Evaluation  Patient identified by MRN, date of birth, ID band Patient awake    Reviewed: Allergy & Precautions, H&P , NPO status , Patient's Chart, lab work & pertinent test results  Airway Mallampati: II  TM Distance: >3 FB Neck ROM: full    Dental  (+) Teeth Intact   Pulmonary neg pulmonary ROS,    breath sounds clear to auscultation       Cardiovascular negative cardio ROS   Rhythm:regular Rate:Normal     Neuro/Psych negative neurological ROS  negative psych ROS   GI/Hepatic negative GI ROS, Neg liver ROS,   Endo/Other  negative endocrine ROS  Renal/GU negative Renal ROS  negative genitourinary   Musculoskeletal negative musculoskeletal ROS (+)   Abdominal   Peds negative pediatric ROS (+)  Hematology negative hematology ROS (+) anemia ,   Anesthesia Other Findings       Reproductive/Obstetrics negative OB ROS (+) Pregnancy                             Anesthesia Physical  Anesthesia Plan  ASA: II  Anesthesia Plan: Spinal   Post-op Pain Management:    Induction:   PONV Risk Score and Plan: 2 and Treatment may vary due to age or medical condition  Airway Management Planned: Natural Airway  Additional Equipment:   Intra-op Plan:   Post-operative Plan:   Informed Consent: I have reviewed the patients History and Physical, chart, labs and discussed the procedure including the risks, benefits and alternatives for the proposed anesthesia with the patient or authorized representative who has indicated his/her understanding and acceptance.   Dental Advisory Given  Plan Discussed with: Anesthesiologist, CRNA and Surgeon  Anesthesia Plan Comments: (Labs checked- platelets confirmed with RN in room.)        Anesthesia Quick Evaluation

## 2018-03-01 NOTE — Anesthesia Postprocedure Evaluation (Signed)
Anesthesia Post Note  Patient: Hayley Herring  Procedure(s) Performed: REPEAT CESAREAN SECTION (N/A )     Patient location during evaluation: PACU Anesthesia Type: Spinal Level of consciousness: oriented and awake and alert Pain management: pain level controlled Vital Signs Assessment: post-procedure vital signs reviewed and stable Respiratory status: spontaneous breathing and respiratory function stable Cardiovascular status: blood pressure returned to baseline and stable Postop Assessment: no headache, no backache and no apparent nausea or vomiting Anesthetic complications: no    Last Vitals:  Vitals:   03/01/18 1015 03/01/18 1033  BP: 106/63 (!) 97/54  Pulse: 70 60  Resp: 16 18  Temp: 36.7 C 36.8 C  SpO2: 99% 99%    Last Pain:  Vitals:   03/01/18 1033  TempSrc: Oral  PainSc:    Pain Goal:                 Lynda Rainwater

## 2018-03-01 NOTE — Op Note (Signed)
Cesarean Section Procedure Note  Indications: previous uterine incision kerr x 1 and breech presentation  Pre-operative Diagnosis: 39 week 0 day pregnancy, prior Low transverse cesarean             Section and breech presentation  Post-operative Diagnosis: same  Surgeon: Caffie Damme, MD  Assistants: Noralyn Pick, CNM  Anesthesia: Spinal anesthesia  ASA Class: 2  Procedure Details   The patient was counseled about the risks, benefits, complications of the cesarean section. The patient concurred with the proposed plan, giving informed consent.  The site of surgery properly noted/marked. The patient was taken to Operating Room # 9, identified as Hayley Herring and the procedure verified as C-Section Delivery. A Time Out was held and the above information confirmed.  After spinal was found to adequate , the patient was placed in the dorsal supine position with a leftward tilt, draped and prepped in the usual sterile manner. A Pfannenstiel incision was made and carried down through the subcutaneous tissue to the fascia.  The fascia was incised in the midline and the fascial incision was extended laterally with Mayo scissors. The superior aspect of the fascial incision was grasped with Coker clamps x2, tented up and the rectus muscles dissected off sharply with the scalpel. The rectus was then dissected off with blunt dissection and Mayo scissors inferiorly. The rectus muscles were separated in the midline. The abdominal peritoneum was adherent to the anterior peritoneum, so this was dissected off using the bovie. The abdominal peritoneum was identified, tented up between two hemostats, entered sharply using Metzenbaum scissors, and the incision was extended superiorly and inferiorly with good visualization of the bladder. The Alexis retractor was then deployed. The vesicouterine peritoneum was identified, tented up, entered sharply with Metzenbaum scissors, and the bladder flap was created  digitally. Scalpel was then used to make a low transverse incision on the uterus which was extended laterally with  blunt dissection. The fetal breech was identified, each leg was delivered  The body was delivered until the shoulders.  Each arm was delivered via Pinard maneuver. The head was delivered via Demaris Callander, [place] maneuver. The A live female infant was bulb suctioned on the operative field cried vigorously, cord was clamped and cut and the infant was passed to the waiting neonatologist. Apgars 8/9. Placenta was then delivered spontaneously, intact and appear normal, the uterus was cleared of all clot and debris. The uterine incision was repaired with #0 Monocryl in running locked fashion. A second imbricating suture was performed using the same suture. There was oozing from the midline portion of the incision. This was made hemostatic by further locking sutures of 0-Monocryl. The incision was hemostatic. Ovaries and tubes were inspected and normal. The Alexis retractor was removed. The abdominal cavity was cleared of all clot and debris. The omentum was further dissected off the abdominal peritoneum. Arista was placed in the abdomen to ensure hemostasis. The peritoneum was reapproximated with 2-0 chromic  in a running fashion, the rectus muscles was reapproximated with #2 chromic in interrupted fashion. The fascia was closed with 0 PDS in a running fashion, starting from each end and meeting in the middle.  The subcuticular layer was irrigated and all bleeders cauterized.    The incision was injected with 30 mL of 0.5% Marcaine. Interrupted sutures of 2-0 plain were used to re-approximate Scarpas fascia.  The skin was closed with 4-0 vicryl in a subcuticular fashion using a Lanny Hurst needle. The incision was dressed with benzoine, steri  strips and pressure dressing. All sponge lap and needle counts were correct x3. Patient tolerated the procedure well and recovered in stable condition following the  procedure.  Instrument, sponge, and needle counts were correct prior the abdominal closure and at the conclusion of the case.   Findings: Live female infant, Apgars 8/9 (not verified by Peds), clear amniotic fluid, normal appearing placenta, normal uterus, bilateral tubes and ovaries  Estimated Blood Loss: 572mL  IVF: 1500 mL         Drains: Foley catheter  Urine output: 100 mL clear urine         Specimens: Placenta to L&D         Implants: none         Complications:  None; patient tolerated the procedure well.         Disposition: PACU - hemodynamically stable.   Hayley Herring STACIA

## 2018-03-01 NOTE — Anesthesia Postprocedure Evaluation (Signed)
Anesthesia Post Note  Patient: Hayley Herring  Procedure(s) Performed: REPEAT CESAREAN SECTION (N/A )     Patient location during evaluation: Mother Baby Anesthesia Type: Spinal Level of consciousness: awake, awake and alert and oriented Pain management: pain level controlled Vital Signs Assessment: post-procedure vital signs reviewed and stable Respiratory status: spontaneous breathing, nonlabored ventilation and respiratory function stable Cardiovascular status: stable Postop Assessment: no headache, no backache, adequate PO intake, no apparent nausea or vomiting and patient able to bend at knees Anesthetic complications: no    Last Vitals:  Vitals:   03/01/18 1230 03/01/18 1330  BP: (!) 89/49 (!) 94/46  Pulse: 65 61  Resp: 18 20  Temp: 37 C 37.1 C  SpO2: 98% 99%    Last Pain:  Vitals:   03/01/18 1330  TempSrc: Oral  PainSc: 0-No pain   Pain Goal:                 Sabin Gibeault

## 2018-03-02 LAB — CBC
HCT: 23.1 % — ABNORMAL LOW (ref 36.0–46.0)
Hemoglobin: 7.4 g/dL — ABNORMAL LOW (ref 12.0–15.0)
MCH: 21.2 pg — ABNORMAL LOW (ref 26.0–34.0)
MCHC: 32 g/dL (ref 30.0–36.0)
MCV: 66.2 fL — AB (ref 80.0–100.0)
Platelets: 239 10*3/uL (ref 150–400)
RBC: 3.49 MIL/uL — ABNORMAL LOW (ref 3.87–5.11)
RDW: 16.5 % — ABNORMAL HIGH (ref 11.5–15.5)
WBC: 10.9 10*3/uL — ABNORMAL HIGH (ref 4.0–10.5)
nRBC: 1.8 % — ABNORMAL HIGH (ref 0.0–0.2)

## 2018-03-02 LAB — BIRTH TISSUE RECOVERY COLLECTION (PLACENTA DONATION)

## 2018-03-02 NOTE — Progress Notes (Signed)
Subjective: Postpartum Day 1: Cesarean Delivery repeat breech Patient reports + flatus and no problems voiding.    Objective: Vital signs in last 24 hours: Temp:  [97.9 F (36.6 C)-99.6 F (37.6 C)] 97.9 F (36.6 C) (01/14 0649) Pulse Rate:  [60-71] 71 (01/14 0649) Resp:  [16-20] 18 (01/14 0649) BP: (89-106)/(43-63) 100/63 (01/14 0649) SpO2:  [98 %-100 %] 99 % (01/14 0649)  Physical Exam:  General: alert, cooperative and no distress Lochia: appropriate Uterine Fundus: firm Incision: healing well, old blood noted on honeycomb DVT Evaluation: No evidence of DVT seen on physical exam.  Recent Labs    03/02/18 0518  HGB 7.4*  HCT 23.1*    Assessment/Plan: Status post Cesarean section. Doing well postoperatively. stated allergy to iron, discussed high iron foods.  Plans on IUD for birth control.  Discharge tomorrow. Continue current care.  Pleas Koch Iriana Artley 03/02/2018, 10:09 AM

## 2018-03-03 DIAGNOSIS — O9081 Anemia of the puerperium: Secondary | ICD-10-CM

## 2018-03-03 MED ORDER — IBUPROFEN 600 MG PO TABS: 600.0000 mg | ORAL_TABLET | Freq: Four times a day (QID) | ORAL | 0 refills | Status: DC

## 2018-03-03 MED ORDER — OXYCODONE HCL 5 MG PO TABS: 5.0000 mg | ORAL_TABLET | ORAL | 0 refills | Status: DC | PRN

## 2018-03-03 NOTE — Discharge Summary (Signed)
Repeat CS OB Discharge Summary     Patient Name: Hayley Herring DOB: January 23, 1991 MRN: [id number]  Date of admission: 03/01/2018 Delivering MD: Sanjuana Kava  Date of delivery: 03/01/2018 Type of delivery: Repeat CS for breech  Newborn Data: Sex: Baby  female Live born female  Birth Weight: 7 lb 6.2 oz (3350 g) APGAR: 8, 9  Newborn Delivery   Birth date/time:  03/01/2018 08:20:00 Delivery type:  C-Section, Low Transverse Trial of labor:  No C-section categorization:  Repeat     Feeding: breast and bottle Infant being discharge to home with mother in stable condition.   Admitting diagnosis: Breech Presentation, Prior Cesarean Section Intrauterine pregnancy: [redacted]w[redacted]d     Secondary diagnosis:  Active Problems:   Single delivery by C-section   Status post repeat low transverse cesarean section   Postpartum anemia   Normal postpartum course                                Complications: None                                                              Intrapartum Procedures: breech extraction and cesarean: low cervical, transverse Postpartum Procedures: none Complications-Operative and Postpartum: none Augmentation: AROM and @delivery    History of Present Illness: Ms. Hayley Herring is a 28 y.o. female, G2P2002, who presents at [redacted]w[redacted]d weeks gestation. The patient has been followed at  [facility]  Her pregnancy has been complicated by: Patient Active Problem List   Diagnosis Date Noted  . Postpartum anemia 03/03/2018  . Normal postpartum course 03/03/2018  . Single delivery by C-section 03/01/2018  . Status post repeat low transverse cesarean section 03/01/2018  . IDA (iron deficiency anemia) 08/26/2017  . Hypertension in pregnancy, preeclampsia, severe, delivered/postpartum 12/20/2013  . Overweight 12/20/2013  . Short stature 12/20/2013  . Cesarean delivery delivered 12/16/2013  . Hydradenitis 12/15/2013  . 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 yo G2P2002 at [redacted]w[redacted]d was admitted to the hospital 03/01/2018 for scheduled cesarean section with the following indication:Elective Repeat and Malpresentation.  Membrane Rupture Time/Date: 8:19 AM ,03/01/2018   Patient delivered a Viable infant.03/01/2018  Details of operation can be found in separate operative note.  Pateint had an uncomplicated postpartum course.  She is ambulating, tolerating a regular diet, passing flatus, and urinating well. Patient is discharged home in stable condition on  03/03/18  Hospital Course--Scheduled Cesarean: Patient was admitted on 03/01/2018 for a scheduled repeat cesarean delivery.   She was taken to the operating room, where Dr. Alwyn Pea performed a repeat LTCS under spinal anesthesia, with delivery of a viable female, with weight and Apgars as listed below. Infant was in good condition and remained at the patient's bedside.  The patient was taken to recovery in good condition.  Patient planned to breast and bottle feed.  On post-op day 1, patient was doing well, tolerating a regular diet, with Hgb of 7.4 and asymptomatic.  Throughout her stay, her physical exam was WNL, her incision was CDI, and her vital signs remained stable.  By post-op day 1, she was up ad lib, tolerating a regular  diet, with good pain control with po med.  She was deemed to have received the full benefit of her hospital stay, and was discharged home in stable condition.  Contraceptive choice is IUD: Skyla at 6 weeks PPV.     Physical exam  Vitals:   03/02/18 0649 03/02/18 1427 03/02/18 2322 03/03/18 0500  BP: 100/63 (!) 91/54 (!) 105/48 (!) 90/45  Pulse: 71 61 66 66  Resp: 18 18 20 16   Temp: 97.9 F (36.6 C) 98.9 F (37.2 C) 98.5 F (36.9 C) 98.5 F (36.9 C)  TempSrc: Oral Oral Oral Oral  SpO2: 99%     Weight:      Height:       General: alert, cooperative and no distress Lochia: appropriate Uterine Fundus: firm Incision:  Healing well with no significant drainage, No significant erythema, Dressing is clean, dry, and intact, honeycomb dressing CDI Perineum: Intact DVT Evaluation: No evidence of DVT seen on physical exam. Negative Homan's sign. No cords or calf tenderness. No significant calf/ankle edema.  Labs: Lab Results  Component Value Date   WBC 10.9 (H) 03/02/2018   HGB 7.4 (L) 03/02/2018   HCT 23.1 (L) 03/02/2018   MCV 66.2 (L) 03/02/2018   PLT 239 03/02/2018   CMP Latest Ref Rng & Units 01/19/2018  Glucose 70 - 99 mg/dL 84  BUN 6 - 20 mg/dL 9  Creatinine 0.44 - 1.00 mg/dL 0.48  Sodium 135 - 145 mmol/L 136  Potassium 3.5 - 5.1 mmol/L 4.9  Chloride 98 - 111 mmol/L 106  CO2 22 - 32 mmol/L 24  Calcium 8.9 - 10.3 mg/dL 8.6(L)  Total Protein 6.5 - 8.1 g/dL 6.5  Total Bilirubin 0.3 - 1.2 mg/dL 0.5  Alkaline Phos 38 - 126 U/L 78  AST 15 - 41 U/L 18  ALT 0 - 44 U/L 11    Date of discharge: 03/03/2018 Discharge Diagnoses: Term Pregnancy-delivered Discharge instruction: per After Visit Summary and "Baby and Me Booklet".  After visit meds:  Allergies as of 03/03/2018      Reactions   Iron Shortness Of Breath   Had a reaction when she was receiving an iron transfusion      Medication List    STOP taking these medications   aspirin EC 81 MG tablet   folic acid 1 MG tablet Commonly known as:  FOLVITE   prenatal multivitamin Tabs tablet     TAKE these medications   ibuprofen 600 MG tablet Commonly known as:  ADVIL,MOTRIN Take 1 tablet (600 mg total) by mouth every 6 (six) hours.   oxyCODONE 5 MG immediate release tablet Commonly known as:  Oxy IR/ROXICODONE Take 1 tablet (5 mg total) by mouth every 4 (four) hours as needed for moderate pain.            Discharge Care Instructions  (From admission, onward)         Start     Ordered   03/03/18 0000  Discharge wound care:    Comments:  Take dressing off on day 5-7 postpartum.  Report increased drainage, redness or warmth.  Clean with water, let soap trickle down body. Can leave steri strips on until they fall off or take them off gently at day 10. Keep open to air, clean and dry.   03/03/18 0957          Activity:           unrestricted and pelvic rest Advance as tolerated. Pelvic rest for 6 weeks.  Diet:                routine Medications: PNV, Ibuprofen and Percocet Postpartum contraception: IUD Skyla Condition:  Pt discharge to home with baby in stable Anemia: Pt to use iron skillet or fish for cooking, pt stated she is has SOB with iron consumption, but has anemia, pt to report s/sx of anemia.   Meds: Allergies as of 03/03/2018      Reactions   Iron Shortness Of Breath   Had a reaction when she was receiving an iron transfusion      Medication List    STOP taking these medications   aspirin EC 81 MG tablet   folic acid 1 MG tablet Commonly known as:  FOLVITE   prenatal multivitamin Tabs tablet     TAKE these medications   ibuprofen 600 MG tablet Commonly known as:  ADVIL,MOTRIN Take 1 tablet (600 mg total) by mouth every 6 (six) hours.   oxyCODONE 5 MG immediate release tablet Commonly known as:  Oxy IR/ROXICODONE Take 1 tablet (5 mg total) by mouth every 4 (four) hours as needed for moderate pain.            Discharge Care Instructions  (From admission, onward)         Start     Ordered   03/03/18 0000  Discharge wound care:    Comments:  Take dressing off on day 5-7 postpartum.  Report increased drainage, redness or warmth. Clean with water, let soap trickle down body. Can leave steri strips on until they fall off or take them off gently at day 10. Keep open to air, clean and dry.   03/03/18 0957          Discharge Follow Up:  Follow-up [facility] Follow up.   Specialty:  Obstetrics and Gynecology Why:  Please make an 4 week incision check and 6 weeks PPV.  Contact information: [address]. [address] [place]  [place] [id number] [place], NP-C, CNM 03/03/2018, 10:04 AM  Noralyn Pick, [place]

## 2018-04-15 ENCOUNTER — Inpatient Hospital Stay: Payer: Medicaid Other | Attending: Family | Admitting: Hematology & Oncology

## 2018-04-15 ENCOUNTER — Inpatient Hospital Stay: Payer: Medicaid Other

## 2018-08-28 ENCOUNTER — Other Ambulatory Visit: Payer: Self-pay | Admitting: Family

## 2018-08-28 DIAGNOSIS — D563 Thalassemia minor: Secondary | ICD-10-CM

## 2019-11-04 ENCOUNTER — Other Ambulatory Visit: Payer: Self-pay | Admitting: Family

## 2019-11-04 DIAGNOSIS — D563 Thalassemia minor: Secondary | ICD-10-CM

## 2021-05-02 ENCOUNTER — Other Ambulatory Visit: Payer: Self-pay | Admitting: Hematology & Oncology

## 2021-10-03 LAB — OB RESULTS CONSOLE HIV ANTIBODY (ROUTINE TESTING): HIV: NONREACTIVE

## 2021-10-03 LAB — OB RESULTS CONSOLE RUBELLA ANTIBODY, IGM: Rubella: IMMUNE

## 2021-10-03 LAB — OB RESULTS CONSOLE HEPATITIS B SURFACE ANTIGEN: Hepatitis B Surface Ag: NEGATIVE

## 2021-10-03 LAB — OB RESULTS CONSOLE ABO/RH: RH Type: POSITIVE

## 2021-10-03 LAB — OB RESULTS CONSOLE ANTIBODY SCREEN: Antibody Screen: NEGATIVE

## 2021-10-03 LAB — HEPATITIS C ANTIBODY: HCV Ab: NEGATIVE

## 2021-10-03 LAB — OB RESULTS CONSOLE RPR: RPR: NONREACTIVE

## 2021-10-09 LAB — OB RESULTS CONSOLE GC/CHLAMYDIA
Chlamydia: NEGATIVE
Neisseria Gonorrhea: NEGATIVE

## 2022-03-10 ENCOUNTER — Encounter: Payer: Self-pay | Admitting: Family

## 2022-03-14 ENCOUNTER — Telehealth (HOSPITAL_COMMUNITY): Payer: Self-pay

## 2022-03-17 ENCOUNTER — Inpatient Hospital Stay: Payer: Medicaid Other | Attending: Hematology | Admitting: Hematology

## 2022-03-17 ENCOUNTER — Other Ambulatory Visit: Payer: Self-pay

## 2022-03-17 ENCOUNTER — Inpatient Hospital Stay: Payer: Medicaid Other

## 2022-03-17 VITALS — BP 99/51 | HR 95 | Temp 97.3°F | Resp 20 | Wt 178.3 lb

## 2022-03-17 DIAGNOSIS — O99013 Anemia complicating pregnancy, third trimester: Secondary | ICD-10-CM

## 2022-03-17 DIAGNOSIS — Z79899 Other long term (current) drug therapy: Secondary | ICD-10-CM | POA: Diagnosis not present

## 2022-03-17 DIAGNOSIS — D509 Iron deficiency anemia, unspecified: Secondary | ICD-10-CM | POA: Insufficient documentation

## 2022-03-17 DIAGNOSIS — Z3A33 33 weeks gestation of pregnancy: Secondary | ICD-10-CM

## 2022-03-17 DIAGNOSIS — D563 Thalassemia minor: Secondary | ICD-10-CM

## 2022-03-17 LAB — IRON AND IRON BINDING CAPACITY (CC-WL,HP ONLY)
Iron: 88 ug/dL (ref 28–170)
Saturation Ratios: 20 % (ref 10.4–31.8)
TIBC: 449 ug/dL (ref 250–450)
UIBC: 361 ug/dL (ref 148–442)

## 2022-03-17 LAB — CBC WITH DIFFERENTIAL (CANCER CENTER ONLY)
Abs Immature Granulocytes: 0.11 10*3/uL — ABNORMAL HIGH (ref 0.00–0.07)
Basophils Absolute: 0 10*3/uL (ref 0.0–0.1)
Basophils Relative: 0 %
Eosinophils Absolute: 0.1 10*3/uL (ref 0.0–0.5)
Eosinophils Relative: 1 %
HCT: 27.9 % — ABNORMAL LOW (ref 36.0–46.0)
Hemoglobin: 9.1 g/dL — ABNORMAL LOW (ref 12.0–15.0)
Immature Granulocytes: 1 %
Lymphocytes Relative: 19 %
Lymphs Abs: 2.2 10*3/uL (ref 0.7–4.0)
MCH: 21.3 pg — ABNORMAL LOW (ref 26.0–34.0)
MCHC: 32.6 g/dL (ref 30.0–36.0)
MCV: 65.3 fL — ABNORMAL LOW (ref 80.0–100.0)
Monocytes Absolute: 0.6 10*3/uL (ref 0.1–1.0)
Monocytes Relative: 5 %
Neutro Abs: 8.5 10*3/uL — ABNORMAL HIGH (ref 1.7–7.7)
Neutrophils Relative %: 74 %
Platelet Count: 229 10*3/uL (ref 150–400)
RBC: 4.27 MIL/uL (ref 3.87–5.11)
RDW: 17.9 % — ABNORMAL HIGH (ref 11.5–15.5)
WBC Count: 11.6 10*3/uL — ABNORMAL HIGH (ref 4.0–10.5)
nRBC: 0.6 % — ABNORMAL HIGH (ref 0.0–0.2)

## 2022-03-17 LAB — CMP (CANCER CENTER ONLY)
ALT: 11 U/L (ref 0–44)
AST: 15 U/L (ref 15–41)
Albumin: 3.4 g/dL — ABNORMAL LOW (ref 3.5–5.0)
Alkaline Phosphatase: 94 U/L (ref 38–126)
Anion gap: 7 (ref 5–15)
BUN: 7 mg/dL (ref 6–20)
CO2: 23 mmol/L (ref 22–32)
Calcium: 8.6 mg/dL — ABNORMAL LOW (ref 8.9–10.3)
Chloride: 107 mmol/L (ref 98–111)
Creatinine: 0.51 mg/dL (ref 0.44–1.00)
GFR, Estimated: >60 mL/min (ref >60)
Glucose, Bld: 73 mg/dL (ref 70–99)
Potassium: 3.7 mmol/L (ref 3.5–5.1)
Sodium: 137 mmol/L (ref 135–145)
Total Bilirubin: 0.5 mg/dL (ref 0.3–1.2)
Total Protein: 6.7 g/dL (ref 6.5–8.1)

## 2022-03-17 LAB — LACTATE DEHYDROGENASE: LDH: 155 U/L (ref 98–192)

## 2022-03-17 LAB — FERRITIN: Ferritin: 99 ng/mL (ref 11–307)

## 2022-03-17 LAB — VITAMIN B12: Vitamin B-12: 272 pg/mL (ref 180–914)

## 2022-03-17 LAB — ABO/RH: ABO/RH(D): AB POS

## 2022-03-17 NOTE — Progress Notes (Signed)
HEMATOLOGY/ONCOLOGY CONSULTATION NOTE  Date of Service: 03/17/2022  Patient Care Team: Virginia Rochester, [place] as PCP - General (Family Medicine)  CHIEF COMPLAINTS/PURPOSE OF CONSULTATION:  Anemia in 3rd trimester of pregnancy Beta Thalassemia-minor  HISTORY OF PRESENTING ILLNESS:   Hayley Herring is a wonderful 32 y.o. female who has been referred to [place] by NP Kylie Hands at [facility] for evaluation and management of Beta Thalassemia.  Patient reports she is [redacted] weeks pregnant and this her third pregnancy. She is due on March 11 and it is planned C-Section.   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 She notes she did go to hematologist, but is unsure of the name of her hematologist.   She denies previous need for IV Iron or blood transfusion. Her hemoglobin was around 8 during her first pregnancy and improved after her pregnancy. Her first pregnancy was C-Section and did not need any IV Iron or blood transfusion.   She denies any previous history of abnormal bleeding or heavy menstrual cycle.  Patient notes that her baseline hemoglobin is around 9 to 11.0.  Her second pregnancy was in 2019/2020 and had planned C-section. She did not need IV Iron or blood transfusion during her second pregnancy as well.   She notes of taking Iron supplement, ferrous sulfate, once a day since 2019. She also takes folic acid tablets. She denies any toxicities with ferrous sulfate.   Patient notes that her hemoglobin was around 9.0-10.0 when she was around 2-3 months pregnant.   She denies fatigue, fever, chills, abnormal bleeding, enlarged lymph nodes, night sweats, back pain, chest pain, or leg swelling.   Patient reports she was diagnosed with rheumatoid arthritis in 2017. She was taking humira for rheumatoid arthritis, but stopped during her pregnancy in 2019. Patient reports that her rheumatoid arthritis has resolved since 2019.  Patient is unsure of her rheumatologist name.   MEDICAL HISTORY:  Past Medical History:  Diagnosis Date   Beta thalassemia minor    Hidradenitis suppurativa    History of pre-eclampsia in prior pregnancy, currently pregnant     SURGICAL HISTORY: Past Surgical History:  Procedure Laterality Date   CESAREAN SECTION N/A 12/16/2013   Procedure: CESAREAN SECTION;  Surgeon: Ena Dawley, MD;  Location: [facility];  Service: Obstetrics;  Laterality: N/A;   CESAREAN SECTION N/A 03/01/2018   Procedure: REPEAT CESAREAN SECTION;  Surgeon: Sanjuana Kava, MD;  Location: [facility];  Service: Obstetrics;  Laterality: N/A;   SKIN GRAFT      SOCIAL HISTORY: Social History   Socioeconomic History   Marital status: Married    Spouse name: Not on file   Number of children: Not on file   Years of education: Not on file   Highest education level: Not on file  Occupational History   Not on file  Tobacco Use   Smoking status: Never   Smokeless tobacco: Never  Vaping Use   Vaping Use: Never used  Substance and Sexual Activity   Alcohol use: No   Drug use: No   Sexual activity: Yes  Other Topics Concern   Not on file  Social History Narrative   Not on file   Social Determinants of Health   Financial Resource Strain: Low Risk  (02/18/2018)   Overall Financial Resource Strain (CARDIA)    Difficulty of Paying Living Expenses: Not hard at all  Food Insecurity: No Food Insecurity (02/18/2018)   Hunger Vital Sign  Worried About Charity fundraiser in the Last Year: Never true    [place] in the Last Year: Never true  Transportation Needs: Unknown (02/18/2018)   PRAPARE - Hydrologist (Medical): No    Lack of Transportation (Non-Medical): Not on file  Physical Activity: Not on file  Stress: No Stress Concern Present (02/18/2018)   [place]    Feeling of Stress : Not at all  Social  Connections: Not on file  Intimate Partner Violence: Not At Risk (02/18/2018)   Humiliation, Afraid, Rape, and Kick questionnaire    Fear of Current or Ex-Partner: No    Emotionally Abused: No    Physically Abused: No    Sexually Abused: No    FAMILY HISTORY: Family History  Problem Relation Age of Onset   Diabetes Mother     ALLERGIES:  is allergic to iron.  MEDICATIONS:  Current Outpatient Medications  Medication Sig Dispense Refill   folic acid (FOLVITE) 1 MG tablet TAKE 1 TABLET(1 MG) BY MOUTH DAILY 30 tablet 11   ibuprofen (ADVIL,MOTRIN) 600 MG tablet Take 1 tablet (600 mg total) by mouth every 6 (six) hours. 30 tablet 0   oxyCODONE (OXY IR/ROXICODONE) 5 MG immediate release tablet Take 1 tablet (5 mg total) by mouth every 4 (four) hours as needed for moderate pain. 15 tablet 0   No current facility-administered medications for this visit.    REVIEW OF SYSTEMS:    10 Point review of Systems was done is negative except as noted above.  PHYSICAL EXAMINATION: ECOG PERFORMANCE STATUS: 1 - Symptomatic but completely ambulatory  . Vitals:   03/17/22 1051  BP: (!) 99/51  Pulse: 95  Resp: 20  Temp: (!) 97.3 F (36.3 C)  SpO2: 100%   Filed Weights   03/17/22 1051  Weight: 178 lb 4.8 oz (80.9 kg)   .Body mass index is 36.01 kg/m.  GENERAL:alert, in no acute distress and comfortable SKIN: no acute rashes, no significant lesions EYES: conjunctiva are pink and non-injected, sclera anicteric OROPHARYNX: MMM, no exudates, no oropharyngeal erythema or ulceration NECK: supple, no JVD LYMPH:  no palpable lymphadenopathy in the cervical, axillary or inguinal regions LUNGS: clear to auscultation b/l with normal respiratory effort HEART: regular rate & rhythm ABDOMEN:  normoactive bowel sounds , non tender, not distended. Extremity: no pedal edema PSYCH: alert & oriented x 3 with fluent speech NEURO: no focal motor/sensory deficits  LABORATORY DATA:  I have reviewed the  data as listed .    Latest Ref Rng & Units 03/17/2022   12:02 PM 03/17/2022   12:01 PM 03/02/2018    5:18 AM  CBC  WBC 4.0 - 10.5 K/uL  11.6  10.9   Hemoglobin 12.0 - 15.0 g/dL  9.1  7.4   Hematocrit 34.0 - 46.6 % 28.1  27.9  23.1   Platelets 150 - 400 K/uL  229  239    .    Latest Ref Rng & Units 03/17/2022   12:01 PM 01/19/2018    3:12 PM 08/26/2017    3:14 PM  CMP  Glucose 70 - 99 mg/dL 73  84  85   BUN 6 - 20 mg/dL '7  9  8   '$ Creatinine 0.44 - 1.00 mg/dL 0.51  0.48  0.60   Sodium 135 - 145 mmol/L 137  136  138   Potassium 3.5 - 5.1 mmol/L 3.7  4.9  4.1   Chloride 98 - 111 mmol/L 107  106  103   CO2 22 - 32 mmol/L '23  24  24   '$ Calcium 8.9 - 10.3 mg/dL 8.6  8.6  9.4   Total Protein 6.5 - 8.1 g/dL 6.7  6.5  7.3   Total Bilirubin 0.3 - 1.2 mg/dL 0.5  0.5  0.8   Alkaline Phos 38 - 126 U/L 94  78  51   AST 15 - 41 U/L '15  18  18   '$ ALT 0 - 44 U/L '11  11  14    '$ . Lab Results  Component Value Date   IRON 88 03/17/2022   TIBC 449 03/17/2022   IRONPCTSAT 20 03/17/2022   (Iron and TIBC)  Lab Results  Component Value Date   FERRITIN 99 03/17/2022     Lab from 02/12/2022:    ALPHA-Thalassemia results from 08/26/2017:   Hemoglobinopathy results from 08/26/2017:   RADIOGRAPHIC STUDIES: I have personally reviewed the radiological images as listed and agreed with the findings in the report. No results found.  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 4) h/o Hidradenitis suppurativa s/p surgical excision.  PLAN: -Discussed potential etiologies of her anemia in the 3rd  trimester of pregnan cy. -her hgb today is at 9.1 which is close to her baseline -ferritin is 99 and iron saturation is 20% - adequate with her current po iron replacement. -continue ferrous sulfate '325mg'$  po daily -- recommended consideration of switching to Iron polysaccharide '150mg'$  po daily instead for better tolerablity and some increase in iron replacement to maintain ferritin >100 - no indication for IV iron at this time time. -RBC folate WNL -B12 272. Would prefer to keep >400. Continue prenatal vitamins. Would add B12 523mg po daily in addition -Answered all of patient's questions.  -labs available and additional workup discussed -Patient will get lab tests done during this visit.  -Discussed the need for blood transfusion for her upcoming C-section if hemoglobin drops below 9.0.    . Orders Placed This Encounter  Procedures   CBC with Differential ([facility])    Standing Status:   Future    Number of Occurrences:   1    Standing Expiration Date:   03/18/2023   CMP ([facility])    Standing Status:   Future    Number of Occurrences:   1    Standing Expiration Date:   03/18/2023   Ferritin    Standing Status:   Future    Number of Occurrences:   1    Standing Expiration Date:   03/18/2023   Vitamin B12    Standing Status:   Future    Number of Occurrences:   1    Standing Expiration Date:   03/17/2023   Folate RBC    Standing Status:   Future    Number of Occurrences:   1    Standing Expiration Date:   03/18/2023   Lactate dehydrogenase    Standing Status:   Future    Number of Occurrences:   1  Standing Expiration Date:   03/18/2023   Haptoglobin    Standing Status:   Future    Number of Occurrences:   1    Standing Expiration Date:   03/18/2023   ABO/RH    Standing Status:   Future    Number of Occurrences:   1    Standing Expiration Date:   03/17/2023    FOLLOW-UP: Labs today Phone visit with Dr Irene Limbo in 1 week .The total time spent in the  appointment was 60 minutes* .  All of the patient's questions were answered with apparent satisfaction. The patient knows to call the clinic with any problems, questions or concerns.   Sullivan Lone MD MS AAHIVMS [facility] Hematology/Oncology Physician [facility]  .*Total Encounter Time as defined by the Centers for Medicare and Medicaid Services includes, in addition to the face-to-face time of a patient visit (documented in the note above) non-face-to-face time: obtaining and reviewing outside history, ordering and reviewing medications, tests or procedures, care coordination (communications with other health care professionals or caregivers) and documentation in the medical record.    I, Cleda Mccreedy, am acting as a Education administrator for Sullivan Lone, MD. .I have reviewed the above documentation for accuracy and completeness, and I agree with the above. Brunetta Genera MD

## 2022-03-18 LAB — FOLATE RBC
Folate, Hemolysate: 581 ng/mL
Folate, RBC: 2068 ng/mL (ref >498)
Hematocrit: 28.1 % — ABNORMAL LOW (ref 34.0–46.6)

## 2022-03-18 LAB — HAPTOGLOBIN: Haptoglobin: 18 mg/dL — ABNORMAL LOW (ref 33–278)

## 2022-03-19 ENCOUNTER — Telehealth: Payer: Self-pay | Admitting: Hematology

## 2022-03-19 NOTE — Telephone Encounter (Signed)
Per 1/29 los, msg left with pt

## 2022-03-24 ENCOUNTER — Encounter: Payer: Self-pay | Admitting: Family

## 2022-03-26 ENCOUNTER — Inpatient Hospital Stay: Payer: Medicaid Other | Attending: Hematology | Admitting: Hematology

## 2022-03-26 DIAGNOSIS — D509 Iron deficiency anemia, unspecified: Secondary | ICD-10-CM | POA: Insufficient documentation

## 2022-03-26 DIAGNOSIS — O99013 Anemia complicating pregnancy, third trimester: Secondary | ICD-10-CM | POA: Diagnosis not present

## 2022-03-26 DIAGNOSIS — D563 Thalassemia minor: Secondary | ICD-10-CM

## 2022-03-26 DIAGNOSIS — Z3A33 33 weeks gestation of pregnancy: Secondary | ICD-10-CM | POA: Diagnosis not present

## 2022-03-26 NOTE — Progress Notes (Signed)
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 female who has been referred to [place] by NP Kylie Hands at [facility] for evaluation and management of Beta Thalassemia.  Patient reports she is [redacted] weeks pregnant and this her third pregnancy. She is due on March 11 and it is planned C-Section.   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 She notes she did go to hematologist, but is unsure of the name of her hematologist.   She denies previous need for IV Iron or blood transfusion. Her hemoglobin was around 8 during her first pregnancy and improved after her pregnancy. Her first pregnancy was C-Section and did not need any IV Iron or blood transfusion.   She denies any previous history of abnormal bleeding or heavy menstrual cycle.  Patient notes that her baseline hemoglobin is around 9 to 11.0.  Her second pregnancy was in 2019/2020 and had planned C-section. She did not need IV Iron or blood transfusion during her second pregnancy as well.   She notes of taking Iron supplement, ferrous sulfate, once a day since 2019. She also takes folic acid tablets. She denies any toxicities with ferrous sulfate.   Patient notes that her hemoglobin was around 9.0-10.0 when she was around 2-3 months pregnant.   She denies fatigue, fever, chills, abnormal bleeding, enlarged lymph nodes, night sweats, back pain, chest pain, or leg swelling.   Patient reports she was diagnosed with rheumatoid arthritis in 2017. She was taking humira for rheumatoid arthritis, but stopped during her pregnancy in 2019. Patient reports that her rheumatoid arthritis has resolved since 2019.  Patient is unsure of her rheumatologist name.   INTERVAL HISTORY:  Hayley Herring is a wonderful 32 y.o. female who is contacted via phone for continued evaluation and management of anemia in 3rd trimester of pregnancy and Beta Thalassemia-minor. Patient was last seen by me on 03/17/2022.  .I connected with Sharl Ma on 03/26/2022 at  3:30 PM EST by telephone visit and verified that I am speaking with the correct person using two identifiers.   Patient reports she has been doing well overall without any new medical concerns since our last visit. Patient notes she had OBGYN appointment yesterday.   She regularly takes Vitamin-D supplement and iron polysaccharides. She is unsure of the vitamin-D dosage.   She denies fever, chills, night sweats, weight loss, back pain, chest pain, or leg swelling.  I discussed the limitations, risks, security and privacy concerns of performing an evaluation and management service by telemedicine and the availability of in-person appointments. I also discussed with the patient that there may be a patient responsible charge related to this service. The patient expressed understanding and agreed to proceed.   Other persons participating in the visit and their role in the encounter: None   Patient's location: Home  Provider's location: [facility]   Chief Complaint: Anemia in 3rd trimester of pregnancy Beta Thalassemia-minor    MEDICAL HISTORY:  Past Medical History:  Diagnosis Date   Beta thalassemia minor    Hidradenitis suppurativa    History of pre-eclampsia in prior pregnancy, currently pregnant     SURGICAL HISTORY: Past Surgical History:  Procedure Laterality Date   CESAREAN SECTION N/A  12/16/2013   Procedure: CESAREAN SECTION;  Surgeon: Ena Dawley, MD;  Location: [facility];  Service: Obstetrics;  Laterality: N/A;   CESAREAN SECTION N/A 03/01/2018   Procedure: REPEAT CESAREAN SECTION;  Surgeon: Sanjuana Kava, MD;  Location: [facility];   Service: Obstetrics;  Laterality: N/A;   SKIN GRAFT      SOCIAL HISTORY: Social History   Socioeconomic History   Marital status: Married    Spouse name: Not on file   Number of children: Not on file   Years of education: Not on file   Highest education level: Not on file  Occupational History   Not on file  Tobacco Use   Smoking status: Never   Smokeless tobacco: Never  Vaping Use   Vaping Use: Never used  Substance and Sexual Activity   Alcohol use: No   Drug use: No   Sexual activity: Yes  Other Topics Concern   Not on file  Social History Narrative   Not on file   Social Determinants of Health   Financial Resource Strain: Low Risk  (02/18/2018)   Overall Financial Resource Strain (CARDIA)    Difficulty of Paying Living Expenses: Not hard at all  Food Insecurity: No Food Insecurity (02/18/2018)   Hunger Vital Sign    Worried About Running Out of Food in the Last Year: Never true    [place] in the Last Year: Never true  Transportation Needs: Unknown (02/18/2018)   PRAPARE - Hydrologist (Medical): No    Lack of Transportation (Non-Medical): Not on file  Physical Activity: Not on file  Stress: No Stress Concern Present (02/18/2018)   [place]    Feeling of Stress : Not at all  Social Connections: Not on file  Intimate Partner Violence: Not At Risk (02/18/2018)   Humiliation, Afraid, Rape, and Kick questionnaire    Fear of Current or Ex-Partner: No    Emotionally Abused: No    Physically Abused: No    Sexually Abused: No    FAMILY HISTORY: Family History  Problem Relation Age of Onset   Diabetes Mother     ALLERGIES:  is allergic to iron.  MEDICATIONS:  Current Outpatient Medications  Medication Sig Dispense Refill   folic acid (FOLVITE) 1 MG tablet TAKE 1 TABLET(1 MG) BY MOUTH DAILY 30 tablet 11   ibuprofen (ADVIL,MOTRIN) 600 MG tablet Take 1 tablet (600 mg  total) by mouth every 6 (six) hours. 30 tablet 0   oxyCODONE (OXY IR/ROXICODONE) 5 MG immediate release tablet Take 1 tablet (5 mg total) by mouth every 4 (four) hours as needed for moderate pain. 15 tablet 0   No current facility-administered medications for this visit.    REVIEW OF SYSTEMS:    10 Point review of Systems was done is negative except as noted above.  PHONE VISIT  LABORATORY DATA:  I have reviewed the data as listed .    Latest Ref Rng & Units 03/17/2022   12:02 PM 03/17/2022   12:01 PM 03/02/2018    5:18 AM  CBC  WBC 4.0 - 10.5 K/uL  11.6  10.9   Hemoglobin 12.0 - 15.0 g/dL  9.1  7.4   Hematocrit 34.0 - 46.6 % 28.1  27.9  23.1   Platelets 150 - 400 K/uL  229  239    .    Latest Ref Rng & Units 03/17/2022   12:01 PM 01/19/2018  3:12 PM 08/26/2017    3:14 PM  CMP  Glucose 70 - 99 mg/dL 73  84  85   BUN 6 - 20 mg/dL 7  9  8   $ Creatinine 0.44 - 1.00 mg/dL 0.51  0.48  0.60   Sodium 135 - 145 mmol/L 137  136  138   Potassium 3.5 - 5.1 mmol/L 3.7  4.9  4.1   Chloride 98 - 111 mmol/L 107  106  103   CO2 22 - 32 mmol/L 23  24  24   $ Calcium 8.9 - 10.3 mg/dL 8.6  8.6  9.4   Total Protein 6.5 - 8.1 g/dL 6.7  6.5  7.3   Total Bilirubin 0.3 - 1.2 mg/dL 0.5  0.5  0.8   Alkaline Phos 38 - 126 U/L 94  78  51   AST 15 - 41 U/L 15  18  18   $ ALT 0 - 44 U/L 11  11  14    $ . Lab Results  Component Value Date   IRON 88 03/17/2022   TIBC 449 03/17/2022   IRONPCTSAT 20 03/17/2022   (Iron and TIBC)  Lab Results  Component Value Date   FERRITIN 99 03/17/2022     Lab from 02/12/2022:    ALPHA-Thalassemia results from 08/26/2017:   Hemoglobinopathy results from 08/26/2017:   RADIOGRAPHIC STUDIES: I have personally reviewed the radiological images as listed and agreed with the findings in the report. No results found.  ASSESSMENT & PLAN:   32 yo [place] female G3P2L2 at 70 weeks pregnancy  1) Anemia in 3rd trimester of pregnancy Likely multifactorial --  baseline anemia from Beta thalassemia minior + Anemia of pregnancy + r/o IDA+ r/o anemia of chronic inflammation from RA/HS 2) h/o Beta-Thalassemia minor with baseline Hgb 10-11 3) H/o Rheumatoid arthritis - currently quiescent , previously was on Humira and notes that her RA has been in remission. 4) h/o Hidradenitis suppurativa s/p surgical excision.  PLAN: -Discussed lab results from 03/17/2022 with the patient. CBC showed WBC of 11.6, hemoglobin of 9.1, and hematocrit of 22.8. CMP showed slightly decreased calcium level at 8.6. Haptoglobin level was at 18. Vitamin B-12 levels were in the normal at 272. LDH is in the normal range at 155. Iron is stable. -Discussed the need for blood transfusion for her upcoming C-section if hemoglobin drops below 9.0.   -Patient does not need IV Iron  -Continue Iron polysaccharide supplement 175m po daily -Recommended 500 mcg Vitamin B-12 supplement.  -Continue Vitamin-D supplement.   No orders of the defined types were placed in this encounter.   FOLLOW-UP: RTC with ObGyn  The total time spent in the appointment was 15 minutes* .  All of the patient's questions were answered with apparent satisfaction. The patient knows to call the clinic with any problems, questions or concerns.   GSullivan LoneMD MS AAHIVMS SRiverlakes Surgery Center LLCCTouchette Regional Hospital IncHematology/Oncology Physician CBrazosport [facility] .*Total Encounter Time as defined by the Centers for Medicare and Medicaid Services includes, in addition to the face-to-face time of a patient visit (documented in the note above) non-face-to-face time: obtaining and reviewing outside history, ordering and reviewing medications, tests or procedures, care coordination (communications with other health care professionals or caregivers) and documentation in the medical record.   I, PCleda Mccreedy am acting as a sEducation administratorfor GSullivan Lone MD. .I have reviewed the above documentation for accuracy and completeness, and I agree with the  above. .Brunetta GeneraMD

## 2022-04-02 ENCOUNTER — Encounter: Payer: Self-pay | Admitting: Family

## 2022-04-14 ENCOUNTER — Encounter (HOSPITAL_COMMUNITY): Payer: Self-pay | Admitting: *Deleted

## 2022-04-14 NOTE — Patient Instructions (Signed)
[name]  04/14/2022   Your procedure is scheduled on:  04/28/2022  Arrive at [place] at Entrance C on [address] [facility]  and [employer] [address]. You are invited to use the FREE valet parking or use the Visitor's parking deck.  Pick up the phone at the desk and dial [phone].  Call this number if you have problems the morning of surgery: [phone]  Remember:   Do not eat food:(After Midnight) Desps de medianoche.  Do not drink clear liquids: (After Midnight) Desps de medianoche.  Take these medicines the morning of surgery with A SIP OF WATER:  none   Do not wear jewelry, make-up or nail polish.  Do not wear lotions, powders, or perfumes. Do not wear deodorant.  Do not shave 48 hours prior to surgery.  Do not bring valuables to the hospital.  [facility] is not   responsible for any belongings or valuables brought to the hospital.  Contacts, dentures or bridgework may not be worn into surgery.  Leave suitcase in the car. After surgery it may be brought to your room.  For patients admitted to the hospital, checkout time is 11:00 AM the day of              discharge.      Please read over the following fact sheets that you were given:     Preparing for Surgery

## 2022-04-16 DIAGNOSIS — O09299 Supervision of pregnancy with other poor reproductive or obstetric history, unspecified trimester: Secondary | ICD-10-CM

## 2022-04-16 DIAGNOSIS — Z8739 Personal history of other diseases of the musculoskeletal system and connective tissue: Secondary | ICD-10-CM

## 2022-04-16 NOTE — H&P (Cosign Needed Addendum)
Hayley Herring is a 32 y.o. female, G3P2002 at 31 weeks, presenting on 04/28/22 for scheduled repeat cesarean, hx 2 prior cesareans.  Known beta thal trait, Hgb 8.2 at 28 weeks, 9.1 on 03/17/22 when referred to Hematology.    Per Hematology visit 03/28/22:   Recommended transfusion if Hgb <9, does not need IV Fe, continue iron polysaccharide supplement, continue Vit B12 supplement, continue Vit D supplement.  Patient Active Problem List   Diagnosis Date Noted   History of pre-eclampsia in prior pregnancy, currently pregnant 04/16/2022   History of rheumatoid arthritis--quiescent at present, no meds 04/16/2022   IDA (iron deficiency anemia) 08/26/2017   Short stature 12/20/2013   Positive GBS test 12/15/2013   Hydradenitis--prior surgical excision 12/15/2013   Thalassemia trait, 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    History of present pregnancy: Patient entered care at 9 weeks.   EDC of 05/05/22 was established by LMP, c/w [place] at 9 6/7 weeks.   Anatomy scan:  20 4/7 weeks, with limited anatomy and an anterior/fundal placenta.   Additional [place] evaluations:   F/u anatomy at 28 weeks--EFW 2+7, 39%ile, cervix 3.93, normal fluid, anatomy complete.   Significant prenatal events:  Declined genetic testing.  On magnesium for HA management.  Hgb 9 at NOB, known beta thal trait, FOB testing neg in past.  Started on Vit D supplement and Fe. Recommended ASA 81 mg for pre-eclampsia prevention--patient elected not to take.  Wanted repeat C/S, requested VL assist.  Elevated 1 hour GTT, passed 3 hour GTT.  Hgb 8.2 at 28 weeks, referred to Hematology.  GBS positive.  Prior hx dx of rheumatoid arthritis, but has been quiescent prior to and during pregnancy, no issues or need for meds. Last evaluation:  04/23/22--VSS, FHR 160.  OB History     Gravida  3   Para  2   Term  2   Preterm      AB      Living  2      SAB      IAB      Ectopic      Multiple  0   Live Births  2         2015--Primary LTCS at 82 5/7  weeks, FTD, female 57+13, delivered with [facility] (AVS), readmitted on POD #3 with pp pre-eclampsia, home on Labetalol during pp recovery 2020--Repeat C/S at 56 weeks, female, 7+6, Dr. Alwyn Pea, breech.  Past Medical History:  Diagnosis Date   Beta thalassemia minor    Hidradenitis suppurativa    History of pre-eclampsia in prior pregnancy, currently pregnant    Past Surgical History:  Procedure Laterality Date   CESAREAN SECTION N/A 12/16/2013   Procedure: CESAREAN SECTION;  Surgeon: Ena Dawley, MD;  Location: [facility];  Service: Obstetrics;  Laterality: N/A;   CESAREAN SECTION N/A 03/01/2018   Procedure: REPEAT CESAREAN SECTION;  Surgeon: Sanjuana Kava, MD;  Location: [facility];  Service: Obstetrics;  Laterality: N/A;   SKIN GRAFT     Family History: family history includes Diabetes in her mother.  Social History:  reports that she has never smoked. She has never used smokeless tobacco. She reports that she does not drink alcohol and does not use drugs.  Patient is from [place], married to FOB, no language barrier.   Prenatal Transfer Tool  Maternal Diabetes: No Genetic Screening: Declined Maternal Ultrasounds/Referrals: Normal Fetal Ultrasounds or other Referrals:  None Maternal Substance Abuse:  No Significant Maternal Medications:  None Significant Maternal Lab Results: Group B  Strep positive  TDAP declined Flu declined  ROS:  Denies leaking, bleeding, noting positive FM, occasional contractions, no other significant sx.  Allergies  Allergen Reactions   Iron Shortness Of Breath    Had a reaction when she was receiving an iron transfusion       unknown if currently breastfeeding.  Chest clear Heart RRR without murmur Abd gravid, NT, FH 39 cm Pelvic: Deferred Ext: WNL  FHR: 160 at last office visit per auscultation UCs:  Occasional  Prenatal labs: ABO, Rh: --/--/AB POS Performed at [facility], [place] [address] [place], [place] [address]   [phone]/29 1202) Antibody: Negative (08/17 0000) Rubella:  Immune (08/17 0000) RPR: Nonreactive (08/17 0000)  HBsAg: Negative (08/17 0000)  HIV: Non-reactive (08/17 0000)  GBS:  Positive Sickle cell/Hgb electrophoresis:  A2, c/w beta thal minor Pap:  02/2021 GC:  Neg 10/09/21 Chlamydia:  Neg 10/09/20 Genetic screenings:  Declined Glucola:  Elevated 1 hour, normal 3 hour Other:   Hgb 9 at NOB, 8.7 at 28 weeks       Assessment/Plan: IUP at 39 weeks Prior C/S x 2, desires repeat Anemia--followed at Hematology Hx pre-eclampsia with first pregnancy Beta thal minor--FOB neg in prior testing GBS positive Short stature--4'11" Hx dx rheumatoid arthritis--quiescent for several years, no meds.  Plan: Admit to [facility] per consult with Dr. Charlesetta Garibaldi for scheduled repeat cesarean. Routine [facility] pre-operative orders Close evaluation of Hgb and clinical status after delivery related to chronic anemia.  Donnel Saxon, CNM, [place] 04/16/2022, 6:57 AM

## 2022-04-25 ENCOUNTER — Encounter (HOSPITAL_COMMUNITY)
Admission: RE | Admit: 2022-04-25 | Discharge: 2022-04-25 | Disposition: A | Discharge status: Home / Self Care | Payer: Medicaid Other | Source: Ambulatory Visit | Attending: Family Medicine | Admitting: Family Medicine

## 2022-04-25 DIAGNOSIS — O34219 Maternal care for unspecified type scar from previous cesarean delivery: Secondary | ICD-10-CM | POA: Diagnosis not present

## 2022-04-25 DIAGNOSIS — Z01812 Encounter for preprocedural laboratory examination: Secondary | ICD-10-CM | POA: Insufficient documentation

## 2022-04-25 LAB — CBC
HCT: 30.4 % — ABNORMAL LOW (ref 36.0–46.0)
Hemoglobin: 9.3 g/dL — ABNORMAL LOW (ref 12.0–15.0)
MCH: 20.6 pg — ABNORMAL LOW (ref 26.0–34.0)
MCHC: 30.6 g/dL (ref 30.0–36.0)
MCV: 67.4 fL — ABNORMAL LOW (ref 80.0–100.0)
Platelets: 233 10*3/uL (ref 150–400)
RBC: 4.51 MIL/uL (ref 3.87–5.11)
RDW: 17.2 % — ABNORMAL HIGH (ref 11.5–15.5)
WBC: 9.2 10*3/uL (ref 4.0–10.5)
nRBC: 0.9 % — ABNORMAL HIGH (ref 0.0–0.2)

## 2022-04-25 LAB — RPR: RPR Ser Ql: NONREACTIVE

## 2022-04-26 NOTE — Anesthesia Preprocedure Evaluation (Signed)
Anesthesia Evaluation  Patient identified by MRN, date of birth, ID band Patient awake    Reviewed: Allergy & Precautions, NPO status , Patient's Chart, lab work & pertinent test results  History of Anesthesia Complications Negative for: history of anesthetic complications  Airway Mallampati: III  TM Distance: >3 FB Neck ROM: Full    Dental  (+) Dental Advisory Given   Pulmonary neg pulmonary ROS   Pulmonary exam normal breath sounds clear to auscultation       Cardiovascular negative cardio ROS  Rhythm:Regular Rate:Normal     Neuro/Psych negative neurological ROS     GI/Hepatic Neg liver ROS,GERD  ,,  Endo/Other  negative endocrine ROS    Renal/GU negative Renal ROS     Musculoskeletal  (+) Arthritis , Rheumatoid disorders,    Abdominal   Peds  Hematology  (+) Blood dyscrasia (beta thalassemis minor), anemia   Anesthesia Other Findings 32 y.o. female, [id number] at 59 weeks, presenting on 04/28/22 for scheduled repeat cesarean, hx 2 prior cesareans.   Known beta thal trait, Hgb 8.2 at 28 weeks, 9.1 on 03/17/22 when referred to Hematology.     Per Hematology visit 03/28/22:   Recommended transfusion if Hgb <9, does not need IV Fe, continue iron polysaccharide supplement, continue Vit B12 supplement, continue Vit D supplement.   On baby aspirin  Reproductive/Obstetrics (+) Pregnancy                              Anesthesia Physical Anesthesia Plan  ASA: 2  Anesthesia Plan: Spinal   Post-op Pain Management:    Induction:   PONV Risk Score and Plan: 2 and Ondansetron, Dexamethasone and Treatment may vary due to age or medical condition  Airway Management Planned: Natural Airway  Additional Equipment:   Intra-op Plan:   Post-operative Plan:   Informed Consent: I have reviewed the patients History and Physical, chart, labs and discussed the procedure including the risks, benefits  and alternatives for the proposed anesthesia with the patient or authorized representative who has indicated his/her understanding and acceptance.     Dental advisory given  Plan Discussed with: Anesthesiologist and CRNA  Anesthesia Plan Comments: (I have discussed risks of neuraxial anesthesia including but not limited to infection, bleeding, nerve injury, back pain, headache, seizures, and failure of block. Patient denies bleeding disorders and is not currently anticoagulated. Labs have been reviewed. Risks and benefits discussed. All patient's questions answered.  )         Anesthesia Quick Evaluation

## 2022-04-27 ENCOUNTER — Encounter (HOSPITAL_COMMUNITY): Payer: Self-pay | Admitting: Obstetrics and Gynecology

## 2022-04-28 ENCOUNTER — Inpatient Hospital Stay (HOSPITAL_COMMUNITY): Payer: Medicaid Other | Admitting: Anesthesiology

## 2022-04-28 ENCOUNTER — Other Ambulatory Visit: Payer: Self-pay

## 2022-04-28 ENCOUNTER — Encounter (HOSPITAL_COMMUNITY): Payer: Self-pay | Admitting: Obstetrics and Gynecology

## 2022-04-28 ENCOUNTER — Encounter (HOSPITAL_COMMUNITY)
Admission: RE | Disposition: A | Discharge status: Home / Self Care | Payer: Self-pay | Source: Home / Self Care | Attending: Obstetrics and Gynecology

## 2022-04-28 ENCOUNTER — Inpatient Hospital Stay (HOSPITAL_COMMUNITY)
Admission: RE | Admit: 2022-04-28 | Discharge: 2022-04-30 | DRG: 787 | Disposition: A | Discharge status: Home / Self Care | Payer: Medicaid Other | Attending: Obstetrics and Gynecology | Admitting: Obstetrics and Gynecology

## 2022-04-28 DIAGNOSIS — D649 Anemia, unspecified: Secondary | ICD-10-CM | POA: Diagnosis not present

## 2022-04-28 DIAGNOSIS — Z3A39 39 weeks gestation of pregnancy: Secondary | ICD-10-CM

## 2022-04-28 DIAGNOSIS — O34219 Maternal care for unspecified type scar from previous cesarean delivery: Secondary | ICD-10-CM

## 2022-04-28 DIAGNOSIS — E3431 Constitutional short stature: Secondary | ICD-10-CM | POA: Diagnosis present

## 2022-04-28 DIAGNOSIS — Z148 Genetic carrier of other disease: Secondary | ICD-10-CM | POA: Diagnosis not present

## 2022-04-28 DIAGNOSIS — D62 Acute posthemorrhagic anemia: Secondary | ICD-10-CM | POA: Diagnosis not present

## 2022-04-28 DIAGNOSIS — B951 Streptococcus, group B, as the cause of diseases classified elsewhere: Secondary | ICD-10-CM | POA: Diagnosis present

## 2022-04-28 DIAGNOSIS — Z8739 Personal history of other diseases of the musculoskeletal system and connective tissue: Secondary | ICD-10-CM

## 2022-04-28 DIAGNOSIS — O9902 Anemia complicating childbirth: Secondary | ICD-10-CM | POA: Diagnosis present

## 2022-04-28 DIAGNOSIS — O99824 Streptococcus B carrier state complicating childbirth: Secondary | ICD-10-CM | POA: Diagnosis present

## 2022-04-28 DIAGNOSIS — O09299 Supervision of pregnancy with other poor reproductive or obstetric history, unspecified trimester: Secondary | ICD-10-CM

## 2022-04-28 DIAGNOSIS — Z98891 History of uterine scar from previous surgery: Principal | ICD-10-CM

## 2022-04-28 LAB — PREPARE RBC (CROSSMATCH)

## 2022-04-28 SURGERY — Surgical Case
Anesthesia: Spinal

## 2022-04-28 MED ORDER — TRANEXAMIC ACID-NACL 1000-0.7 MG/100ML-% IV SOLN
INTRAVENOUS | Status: AC
  Filled 2022-04-28: qty 100

## 2022-04-28 MED ORDER — DEXAMETHASONE SODIUM PHOSPHATE 4 MG/ML IJ SOLN
INTRAMUSCULAR | Status: AC
  Filled 2022-04-28: qty 2

## 2022-04-28 MED ORDER — PROMETHAZINE HCL 25 MG/ML IJ SOLN: 6.2500 mg | INTRAMUSCULAR | Status: DC | PRN

## 2022-04-28 MED ORDER — OXYTOCIN-SODIUM CHLORIDE 30-0.9 UT/500ML-% IV SOLN: 2.5000 [IU]/h | INTRAVENOUS | Status: AC

## 2022-04-28 MED ORDER — DIPHENHYDRAMINE HCL 25 MG PO CAPS: 25.0000 mg | ORAL_CAPSULE | Freq: Four times a day (QID) | ORAL | Status: DC | PRN

## 2022-04-28 MED ORDER — OXYCODONE-ACETAMINOPHEN 5-325 MG PO TABS
1.0000 | ORAL_TABLET | ORAL | Status: DC | PRN
  Filled 2022-04-28: qty 2

## 2022-04-28 MED ORDER — SIMETHICONE 80 MG PO CHEW: 80.0000 mg | CHEWABLE_TABLET | ORAL | Status: DC | PRN

## 2022-04-28 MED ORDER — TRANEXAMIC ACID-NACL 1000-0.7 MG/100ML-% IV SOLN
1000.0000 mg | INTRAVENOUS | Status: AC
  Administered 2022-04-28: 1000 mg via INTRAVENOUS

## 2022-04-28 MED ORDER — ZOLPIDEM TARTRATE 5 MG PO TABS: 5.0000 mg | ORAL_TABLET | Freq: Every evening | ORAL | Status: DC | PRN

## 2022-04-28 MED ORDER — FENTANYL CITRATE (PF) 100 MCG/2ML IJ SOLN
INTRAMUSCULAR | Status: AC
  Filled 2022-04-28: qty 2

## 2022-04-28 MED ORDER — PHENYLEPHRINE HCL-NACL 20-0.9 MG/250ML-% IV SOLN
INTRAVENOUS | Status: DC | PRN
  Administered 2022-04-28: 60 ug/min via INTRAVENOUS

## 2022-04-28 MED ORDER — STERILE WATER FOR IRRIGATION IR SOLN
Status: DC | PRN
  Administered 2022-04-28: 1

## 2022-04-28 MED ORDER — BUPIVACAINE IN DEXTROSE 0.75-8.25 % IT SOLN
INTRATHECAL | Status: DC | PRN
  Administered 2022-04-28: 1.4 mL via INTRATHECAL

## 2022-04-28 MED ORDER — OXYCODONE HCL 5 MG PO TABS: 5.0000 mg | ORAL_TABLET | Freq: Once | ORAL | Status: DC | PRN

## 2022-04-28 MED ORDER — ONDANSETRON HCL 4 MG/2ML IJ SOLN
INTRAMUSCULAR | Status: DC | PRN
  Administered 2022-04-28: 4 mg via INTRAVENOUS

## 2022-04-28 MED ORDER — ACETAMINOPHEN 10 MG/ML IV SOLN
1000.0000 mg | Freq: Once | INTRAVENOUS | Status: DC | PRN
  Administered 2022-04-28: 1000 mg via INTRAVENOUS

## 2022-04-28 MED ORDER — OXYTOCIN-SODIUM CHLORIDE 30-0.9 UT/500ML-% IV SOLN
INTRAVENOUS | Status: AC
  Filled 2022-04-28: qty 500

## 2022-04-28 MED ORDER — FENTANYL CITRATE (PF) 100 MCG/2ML IJ SOLN: 25.0000 ug | INTRAMUSCULAR | Status: DC | PRN

## 2022-04-28 MED ORDER — COCONUT OIL OIL: 1.0000 | TOPICAL_OIL | Status: DC | PRN

## 2022-04-28 MED ORDER — MORPHINE SULFATE (PF) 0.5 MG/ML IJ SOLN
INTRAMUSCULAR | Status: DC | PRN
  Administered 2022-04-28: 150 ug via EPIDURAL

## 2022-04-28 MED ORDER — SODIUM CHLORIDE 0.9 % IR SOLN
Status: DC | PRN
  Administered 2022-04-28: 1000 mL

## 2022-04-28 MED ORDER — SIMETHICONE 80 MG PO CHEW
80.0000 mg | CHEWABLE_TABLET | Freq: Three times a day (TID) | ORAL | Status: DC
  Administered 2022-04-28 – 2022-04-30 (×4): 80 mg via ORAL
  Filled 2022-04-28 (×4): qty 1

## 2022-04-28 MED ORDER — CEFAZOLIN SODIUM-DEXTROSE 2-4 GM/100ML-% IV SOLN
INTRAVENOUS | Status: AC
  Filled 2022-04-28: qty 100

## 2022-04-28 MED ORDER — KETOROLAC TROMETHAMINE 30 MG/ML IJ SOLN: 30.0000 mg | Freq: Once | INTRAMUSCULAR | Status: DC | PRN

## 2022-04-28 MED ORDER — LACTATED RINGERS IV SOLN: INTRAVENOUS | Status: DC

## 2022-04-28 MED ORDER — SODIUM CHLORIDE 0.9 % IV SOLN: 500.0000 mg | INTRAVENOUS | Status: DC

## 2022-04-28 MED ORDER — CEFAZOLIN SODIUM-DEXTROSE 2-4 GM/100ML-% IV SOLN
2.0000 g | INTRAVENOUS | Status: AC
  Administered 2022-04-28: 2 g via INTRAVENOUS

## 2022-04-28 MED ORDER — MENTHOL 3 MG MT LOZG: 1.0000 | LOZENGE | OROMUCOSAL | Status: DC | PRN

## 2022-04-28 MED ORDER — WITCH HAZEL-GLYCERIN EX PADS: 1.0000 | MEDICATED_PAD | CUTANEOUS | Status: DC | PRN

## 2022-04-28 MED ORDER — OXYTOCIN-SODIUM CHLORIDE 30-0.9 UT/500ML-% IV SOLN
INTRAVENOUS | Status: DC | PRN
  Administered 2022-04-28: 400 [IU] via INTRAVENOUS

## 2022-04-28 MED ORDER — SOD CITRATE-CITRIC ACID 500-334 MG/5ML PO SOLN
30.0000 mL | ORAL | Status: AC
  Administered 2022-04-28: 30 mL via ORAL

## 2022-04-28 MED ORDER — SENNOSIDES-DOCUSATE SODIUM 8.6-50 MG PO TABS
2.0000 | ORAL_TABLET | Freq: Every day | ORAL | Status: DC
  Administered 2022-04-29 – 2022-04-30 (×2): 2 via ORAL
  Filled 2022-04-28 (×2): qty 2

## 2022-04-28 MED ORDER — MORPHINE SULFATE (PF) 0.5 MG/ML IJ SOLN
INTRAMUSCULAR | Status: AC
  Filled 2022-04-28: qty 10

## 2022-04-28 MED ORDER — ONDANSETRON HCL 4 MG/2ML IJ SOLN
INTRAMUSCULAR | Status: AC
  Filled 2022-04-28: qty 2

## 2022-04-28 MED ORDER — DIBUCAINE (PERIANAL) 1 % EX OINT: 1.0000 | TOPICAL_OINTMENT | CUTANEOUS | Status: DC | PRN

## 2022-04-28 MED ORDER — OXYCODONE HCL 5 MG/5ML PO SOLN: 5.0000 mg | Freq: Once | ORAL | Status: DC | PRN

## 2022-04-28 MED ORDER — IBUPROFEN 600 MG PO TABS
600.0000 mg | ORAL_TABLET | Freq: Four times a day (QID) | ORAL | Status: DC | PRN
  Administered 2022-04-28 – 2022-04-30 (×8): 600 mg via ORAL
  Filled 2022-04-28 (×8): qty 1

## 2022-04-28 MED ORDER — FENTANYL CITRATE (PF) 100 MCG/2ML IJ SOLN
INTRAMUSCULAR | Status: DC | PRN
  Administered 2022-04-28: 15 ug via INTRATHECAL

## 2022-04-28 MED ORDER — ACETAMINOPHEN 325 MG PO TABS
650.0000 mg | ORAL_TABLET | ORAL | Status: DC | PRN
  Administered 2022-04-28 – 2022-04-30 (×8): 650 mg via ORAL
  Filled 2022-04-28 (×8): qty 2

## 2022-04-28 MED ORDER — DEXAMETHASONE SODIUM PHOSPHATE 4 MG/ML IJ SOLN
INTRAMUSCULAR | Status: DC | PRN
  Administered 2022-04-28: 8 mg via INTRAVENOUS

## 2022-04-28 MED ORDER — SOD CITRATE-CITRIC ACID 500-334 MG/5ML PO SOLN
ORAL | Status: AC
  Filled 2022-04-28: qty 30

## 2022-04-28 SURGICAL SUPPLY — 37 items
APL PRP STRL LF DISP 70% ISPRP (MISCELLANEOUS) ×2
APL SKNCLS STERI-STRIP NONHPOA (GAUZE/BANDAGES/DRESSINGS) ×1
BENZOIN TINCTURE PRP APPL 2/3 (GAUZE/BANDAGES/DRESSINGS) ×1 IMPLANT
CHLORAPREP W/TINT 26 (MISCELLANEOUS) ×2 IMPLANT
CLAMP UMBILICAL CORD (MISCELLANEOUS) ×1 IMPLANT
CLOTH BEACON ORANGE TIMEOUT ST (SAFETY) ×1 IMPLANT
DRAIN JACKSON PRT FLT 10 (DRAIN) IMPLANT
DRSG OPSITE POSTOP 4X10 (GAUZE/BANDAGES/DRESSINGS) ×1 IMPLANT
ELECT REM PT RETURN 9FT ADLT (ELECTROSURGICAL) ×1
ELECTRODE REM PT RTRN 9FT ADLT (ELECTROSURGICAL) ×1 IMPLANT
EVACUATOR SILICONE 100CC (DRAIN) IMPLANT
EXTRACTOR VACUUM M CUP 4 TUBE (SUCTIONS) IMPLANT
GAUZE SPONGE 4X4 12PLY STRL LF (GAUZE/BANDAGES/DRESSINGS) IMPLANT
GLOVE BIO SURGEON STRL SZ 6.5 (GLOVE) ×1 IMPLANT
GLOVE BIOGEL PI IND STRL 7.0 (GLOVE) ×2 IMPLANT
GOWN STRL REUS W/TWL LRG LVL3 (GOWN DISPOSABLE) ×2 IMPLANT
KIT ABG SYR 3ML LUER SLIP (SYRINGE) IMPLANT
NDL HYPO 25X5/8 SAFETYGLIDE (NEEDLE) IMPLANT
NEEDLE HYPO 25X5/8 SAFETYGLIDE (NEEDLE) IMPLANT
NS IRRIG 1000ML POUR BTL (IV SOLUTION) ×1 IMPLANT
PACK C SECTION WH (CUSTOM PROCEDURE TRAY) ×1 IMPLANT
PAD OB MATERNITY 4.3X12.25 (PERSONAL CARE ITEMS) ×1 IMPLANT
RTRCTR C-SECT PINK 25CM LRG (MISCELLANEOUS) IMPLANT
STRIP CLOSURE SKIN 1/2X4 (GAUZE/BANDAGES/DRESSINGS) ×1 IMPLANT
SUT CHROMIC 0 CT 1 (SUTURE) ×1 IMPLANT
SUT MNCRL AB 3-0 PS2 27 (SUTURE) ×1 IMPLANT
SUT PLAIN 2 0 (SUTURE) ×2
SUT PLAIN 2 0 XLH (SUTURE) ×1 IMPLANT
SUT PLAIN ABS 2-0 CT1 27XMFL (SUTURE) ×2 IMPLANT
SUT SILK 2 0 SH (SUTURE) IMPLANT
SUT VIC AB 0 CTX 36 (SUTURE) ×4
SUT VIC AB 0 CTX36XBRD ANBCTRL (SUTURE) ×4 IMPLANT
SUT VIC AB 2-0 SH 27 (SUTURE)
SUT VIC AB 2-0 SH 27XBRD (SUTURE) IMPLANT
TOWEL OR 17X24 6PK STRL BLUE (TOWEL DISPOSABLE) ×1 IMPLANT
TRAY FOLEY W/BAG SLVR 14FR LF (SET/KITS/TRAYS/PACK) ×1 IMPLANT
WATER STERILE IRR 1000ML POUR (IV SOLUTION) ×1 IMPLANT

## 2022-04-28 NOTE — Op Note (Signed)
Cesarean Section Procedure Note   Candid Cernak  04/28/2022  Indications: Scheduled Proceedure/Maternal Request   Pre-operative Diagnosis: repeat cesearean section.   Post-operative Diagnosis: Same   Surgeon: Surgeon(s) and Role:    * Crawford Givens, MD - Primary   Assistants: Donnel Saxon CNM   Anesthesia: spinal   Procedure Details:  The patient was seen in the Holding Room. The risks, benefits, complications, treatment options, and expected outcomes were discussed with the patient. The patient concurred with the proposed plan, giving informed consent. identified as Hayley Herring and the procedure verified as C-Section Delivery. A Time Out was held and the above information confirmed.  After induction of anesthesia, the patient was draped and prepped in the usual sterile manner. A transverse incision was made and carried down through the subcutaneous tissue to the fascia. Fascial incision was made in the midline and extended transversely. The fascia was separated from the underlying rectus muscle superiorly and inferiorly. The peritoneum was identified and entered. Peritoneal incision was extended longitudinally with good visualization of bowel and bladder. The utero-vesical peritoneal reflection was incised transversely and the bladder flap was bluntly freed from the lower uterine segment.  An alexsis retractor was placed in the abdomen.   A low transverse uterine incision was made. Delivered from cephalic presentation was a  infant, with Apgar scores of 9 at one minute and 9 at five minutes. Cord ph was not sent the umbilical cord was clamped and cut cord blood was obtained for evaluation. The placenta was removed Intact and appeared normal. The uterine outline, tubes and ovaries appeared normal}. The uterine incision was closed with running locked sutures of 0Vicryl. A second layer 0 vicrlyl was used to imbricate the uterine incision    Hemostasis was observed. Lavage was carried out until  clear. The alexsis was removed.  The peritoneum was closed with 0 chromic.  The muscles were examined and any bleeders were made hemostatic using bovie cautery device.   The fascia was then reapproximated with running sutures of 0 vicryl.  The subcutaneous tissue was reapproximated  With interrupted stitches using 2-0 plain gut. The subcuticular closure was performed using 3-58moocryl     Instrument, sponge, and needle counts were correct prior the abdominal closure and were correct at the conclusion of the case.    Findings: infant was delivered from vtx presentation. The fluid was cleart.  The uterus tubes and ovaries appeared normal.     Estimated Blood Loss: 456 cc   Total IV Fluids: 8038m  Urine Output:  100CC OF clear urine  Specimens: none  Complications: no complications  Disposition: PACU - hemodynamically stable.   Maternal Condition: stable   Baby condition / location:  Couplet care / Skin to Skin  Attending Attestation: I performed the procedure.   Signed: Surgeon(s): DiCrawford GivensMD

## 2022-04-28 NOTE — Transfer of Care (Signed)
Immediate Anesthesia Transfer of Care Note  Patient: Hayley Herring  Procedure(s) Performed: CESAREAN SECTION  Patient Location: PACU  Anesthesia Type:Spinal  Level of Consciousness: awake, alert , and oriented  Airway & Oxygen Therapy: Patient Spontanous Breathing  Post-op Assessment: Report given to RN and Post -op Vital signs reviewed and stable  Post vital signs: Reviewed and stable  Last Vitals:  Vitals Value Taken Time  BP 115/68 04/28/22 0932  Temp    Pulse 65 04/28/22 0935  Resp 14 04/28/22 0935  SpO2 99 % 04/28/22 0935  Vitals shown include unvalidated device data.  Last Pain:  Vitals:   04/28/22 0605  TempSrc: Oral  PainSc: 0-No pain         Complications: No notable events documented.

## 2022-04-28 NOTE — Lactation Note (Signed)
This note was copied from a baby's chart. Lactation Consultation Note  Patient Name: Hayley Herring [id number] Date: 04/28/2022 Age:34 hours  Experienced BF mom declines lactation services.    Maternal Data    Feeding    LATCH Score                    Lactation Tools Discussed/Used    Interventions    Discharge    Consult Status Consult Status: Complete    Deeanna Beightol G 04/28/2022, 8:31 PM

## 2022-04-28 NOTE — Anesthesia Procedure Notes (Signed)
Spinal  Patient location during procedure: OR Start time: 04/28/2022 8:16 AM End time: 04/28/2022 8:19 AM Reason for block: surgical anesthesia Staffing Performed: anesthesiologist and other anesthesia staff  Anesthesiologist: Nilda Simmer, MD Performed by: Nilda Simmer, MD Authorized by: Nilda Simmer, MD   Preanesthetic Checklist Completed: patient identified, IV checked, site marked, risks and benefits discussed, surgical consent, monitors and equipment checked, pre-op evaluation and timeout performed Spinal Block Patient position: sitting Prep: DuraPrep Patient monitoring: blood pressure and continuous pulse ox Approach: midline Location: L3-4 Injection technique: single-shot Needle Needle type: Pencan  Needle gauge: 24 G Needle length: 9 cm Assessment Sensory level: T4 Additional Notes Risks and benefits of neuraxial anesthesia including, but not limited to, infection, bleeding, local anesthetic toxicity, headache, hypotension, back pain, block failure, etc. were discussed with the patient. The patient expressed understanding and consented to the procedure. I confirmed that the patient has no bleeding disorders and is not taking blood thinners. I confirmed the patient's last platelet count with the nurse. Monitors were applied. A time-out was performed immediately prior to the procedure. Sterile technique was used throughout the whole procedure.   2 attempts. First attempt by Fernanda Drum Mendel Ryder). Second attempt by MDA.

## 2022-04-28 NOTE — Anesthesia Postprocedure Evaluation (Signed)
Anesthesia Post Note  Patient: Hayley Herring  Procedure(s) Performed: [place]     Patient location during evaluation: PACU Anesthesia Type: Spinal Level of consciousness: awake Pain management: pain level controlled Vital Signs Assessment: post-procedure vital signs reviewed and stable Respiratory status: spontaneous breathing, respiratory function stable and nonlabored ventilation Cardiovascular status: blood pressure returned to baseline and stable Postop Assessment: no headache, no backache and no apparent nausea or vomiting Anesthetic complications: no   No notable events documented.  Last Vitals:  Vitals:   04/28/22 1015 04/28/22 1030  BP: 92/63 115/61  Pulse: 65   Resp: 15   Temp: 36.7 C   SpO2: 96%     Last Pain:  Vitals:   04/28/22 1030  TempSrc:   PainSc: 0-No pain   Pain Goal:    LLE Motor Response: Purposeful movement (04/28/22 1030) LLE Sensation: Tingling (04/28/22 1030) RLE Motor Response: Purposeful movement (04/28/22 1030) RLE Sensation: Tingling (04/28/22 1030) L Sensory Level: L3-Anterior knee, lower leg (04/28/22 1030) R Sensory Level: L3-Anterior knee, lower leg (04/28/22 1030) Epidural/Spinal Function Cutaneous sensation: Tingles (04/28/22 1030), Patient able to flex knees: Yes (04/28/22 1030), Patient able to lift hips off bed: No (04/28/22 1030), Back pain beyond tenderness at insertion site: No (04/28/22 1030), Progressively worsening motor and/or sensory loss: No (04/28/22 1030), Bowel and/or bladder incontinence post epidural: No (04/28/22 1030)  Nilda Simmer

## 2022-04-29 DIAGNOSIS — D62 Acute posthemorrhagic anemia: Secondary | ICD-10-CM | POA: Diagnosis not present

## 2022-04-29 LAB — BPAM RBC
Blood Product Expiration Date: 202403152359
Blood Product Expiration Date: 202404042359
ISSUE DATE / TIME: 202402140926
ISSUE DATE / TIME: 202403071004
Unit Type and Rh: 6200
Unit Type and Rh: 6200

## 2022-04-29 LAB — CBC
HCT: 23.6 % — ABNORMAL LOW (ref 36.0–46.0)
Hemoglobin: 7.3 g/dL — ABNORMAL LOW (ref 12.0–15.0)
MCH: 20.6 pg — ABNORMAL LOW (ref 26.0–34.0)
MCHC: 30.9 g/dL (ref 30.0–36.0)
MCV: 66.5 fL — ABNORMAL LOW (ref 80.0–100.0)
Platelets: 213 10*3/uL (ref 150–400)
RBC: 3.55 MIL/uL — ABNORMAL LOW (ref 3.87–5.11)
RDW: 17.2 % — ABNORMAL HIGH (ref 11.5–15.5)
WBC: 11.7 10*3/uL — ABNORMAL HIGH (ref 4.0–10.5)
nRBC: 0.9 % — ABNORMAL HIGH (ref 0.0–0.2)

## 2022-04-29 LAB — TYPE AND SCREEN
ABO/RH(D): AB POS
Antibody Screen: NEGATIVE
Unit division: 0
Unit division: 0

## 2022-04-29 NOTE — Progress Notes (Addendum)
Hayley Herring [id number] Postpartum Postoperative Day # 1  Hayley Herring, [id number], [redacted]w[redacted]d S/P pt was admitted on 3/11 for RCS x3 LT Cesarean Section due to RCS. Pt was taken to OR on 3/11 with dr dillard under spinal anesthesia, had a viable baby female, with h/o beta thal minor, allergic to IV Iron, QBL was 4534m, hgb drop of 9.3 to 7.3, on po iron, declined 2 unit for RBC which was recommended, pt denies s/sx of anemia, and tolerates po iron.    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     Active Ambulatory Problems    Diagnosis Date Noted   Thalassemia trait, 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   Positive GBS test 12/15/2013   Hydradenitis--prior surgical excision 12/15/2013   Short stature 12/20/2013   IDA (iron deficiency anemia) 08/26/2017   Resolved Ambulatory Problems    Diagnosis Date Noted   Normal labor 12/15/2013   Cesarean delivery delivered 12/16/2013   Preeclampsia 12/20/2013   Hypertension in pregnancy, preeclampsia, severe, delivered/postpartum 12/20/2013   Postpartum hypertension 12/20/2013   Overweight 12/20/2013   Single delivery by C-section 03/01/2018   Status post repeat low transverse cesarean section 03/01/2018   Postpartum anemia 03/03/2018   Normal postpartum course 03/03/2018   Past Medical History:  Diagnosis Date   Beta thalassemia minor    Hidradenitis suppurativa    History of pre-eclampsia in prior pregnancy, currently pregnant      Subjective: Patient up ad lib, denies syncope  or dizziness. Reports consuming regular diet without issues and denies N/V. Patient reports 0 bowel movement + passing flatus.  Denies issues with urination and reports bleeding is "lighter."  Patient is breastfeeding and reports going well.  Desires undecided for postpartum contraception.  Pain is being appropriately managed with use of po meds.    Objective: Patient Vitals for the past 24 hrs:  BP Temp Temp src Pulse Resp SpO2  04/29/22 0540 107/63 98.6 F (37 C) Oral 63 18 100 %  04/28/22 1723 -- 98.8 F (37.1 C) Oral -- 17 100 %  04/28/22 1430 (!) 116/55 98.1 F (36.7 C) Oral (!) 59 18 97 %  04/28/22 1333 (!) 106/58 98.3 F (36.8 C) Oral (!) 57 16 98 %     Physical Exam:  General: alert, cooperative, and appears stated age Mood/Affect: happy Lungs: clear to auscultation, no wheezes, rales or rhonchi, symmetric air entry.  Heart: normal rate, regular rhythm, normal S1, S2, no murmurs, rubs, clicks or gallops. Breast: breasts appear normal, no suspicious masses, no skin or nipple changes or axillary nodes. Abdomen:  + bowel sounds, soft, non-tender Incision: healing well, no significant drainage, no dehiscence, Honeycomb dressing  Uterine Fundus: firm, involution -1 Lochia: appropriate Skin: Warm, Dry. DVT Evaluation: No evidence of DVT seen on physical exam.  Labs: Recent Labs    04/29/22 0443  HGB 7.3*  HCT 23.6*  WBC 11.7*    CBG (last 3)  No results for input(s): "GLUCAP" in the last 72 hours.   I/O: I/O last 3 completed shifts: In: 800 [  I.V.:700; IV Piggyback:100] Out: 1956 [Urine:1500; Blood:456]   Assessment Postpartum Postoperative Day # 1  Hayley Herring, [phone], [redacted]w[redacted]d S/P pt was admitted on 3/11 for RCS x3 LT Cesarean Section due to RCS. Pt was taken to OR on 3/11 with dr dillard under spinal anesthesia, had a viable baby female, with h/o beta thal minor, allergic to IV Iron, QBL was 451m, hgb drop of 9.3 to 7.3, on po iron, declined 2 unit for RBC which  was recommended, pt denies s/sx of anemia, and tolerates po iron.   Pt stable. -1 Involution. Breast Feeding. Hemodynamically Stable.  Plan: Continue other mgmt as ordered VTE Prophylactics: SCD, ambulated as tolerates.  Pain control: Motrin/Tylenol/Narcotics PRN Education given regarding options for contraception, including barrier methods, injectable contraception, IUD placement, oral contraceptives.  Plan for discharge tomorrow, Breastfeeding, and Lactation consult  Dr. RoMancel Baleo be updated on patient status  JaGenevaFNP-C, PMHNP-BC  [address] [place] [id number]: [phone] Phone: [phone]: [phone]/01/2023  1:07 PM

## 2022-04-29 NOTE — Discharge Instructions (Signed)
Hayley Herring,  1. Leave the Honey dressing on for about 7 days after your delivery.  On the day of removing the honey comb dressing ensure the dressing is soaked wet with water before peeling it off.   2. You may shower as usual ensuring to pat the dressing dry after showering.       3.  The dressing may come off by itself before 7  days are over and that's okay.  4.  Once dressing comes off you may shower as usual and just pat the area dry gently after showering.   5. If any signs of infection such as redness or pus like drainage from the incision please call the office.      Dr. Melvyn Neth [facility] [phone]

## 2022-04-30 MED ORDER — OXYCODONE-ACETAMINOPHEN 5-325 MG PO TABS: 1.0000 | ORAL_TABLET | ORAL | 0 refills | Status: AC | PRN

## 2022-04-30 MED ORDER — IBUPROFEN 600 MG PO TABS: 600.0000 mg | ORAL_TABLET | Freq: Four times a day (QID) | ORAL | 0 refills | Status: AC | PRN

## 2022-04-30 NOTE — Discharge Summary (Signed)
Postpartum Discharge Summary     Patient Name: Hayley Herring DOB: December 28, 1990 MRN: [id number]  Date of admission: 04/28/2022 Delivery date:04/28/2022  Delivering provider: Crawford Givens  Date of discharge: 04/30/2022  Admitting diagnosis: H/O: C-section [Z98.891] S/P C-section [Z98.891] Intrauterine pregnancy: [redacted]w[redacted]d    Secondary diagnosis:  Principal Problem:   H/O: C-section Active Problems:   Positive GBS test   History of pre-eclampsia in prior pregnancy, currently pregnant   History of rheumatoid arthritis--quiescent at present, no meds   S/P C-section   Acute blood loss anemia   Normal postpartum course  Additional problems: Beta Thalasemia trait    Discharge diagnosis: Term Pregnancy Delivered                                              Post partum procedures: None Complications: None  Hospital course: Sceduled C/S   32y.o. yo G3P3003 at 376w0das admitted to the hospital 04/28/2022 for scheduled cesarean section with the following indication:Elective Repeat.Delivery details are as follows:  Membrane Rupture Time/Date:  ,   Delivery Method:C-Section, Low Transverse  Details of operation can be found in separate operative note.  Patient had a antepartum and postpartum course complicated by anemia, Hgb dropped from 9.3 to 7.3. Intra-operative blood loss was 456cc. She declined blood or IV iron transfusion, but opted for oral iron tablets usage.  She is ambulating, tolerating a regular diet, passing flatus, and urinating well. Patient is discharged home in stable condition on  04/30/22        Newborn Data: Birth date:04/28/2022  Birth time:8:46 AM  Gender:Female  Living status:Living  Apgars:9 ,9  Weight:2860 g     Physical exam  Vitals:   04/29/22 0540 04/29/22 1500 04/29/22 1950 04/30/22 0505  BP: 107/63 102/60 108/73 111/75  Pulse: 63 66 74 77  Resp: '18 17 18 18  '$ Temp: 98.6 F (37 C) 98.3 F (36.8 C) 98.8 F (37.1 C) 99 F (37.2 C)  TempSrc: Oral Oral  Oral Oral  SpO2: 100%  99% 100%  Weight:      Height:       General: alert, cooperative, and no distress Lochia: appropriate Uterine Fundus: firm Incision: Dressing with small patches of dried blood, otherwise clean, dry and intact.  DVT Evaluation: No evidence of DVT seen on physical exam. Labs: Lab Results  Component Value Date   WBC 11.7 (H) 04/29/2022   HGB 7.3 (L) 04/29/2022   HCT 23.6 (L) 04/29/2022   MCV 66.5 (L) 04/29/2022   PLT 213 04/29/2022      Latest Ref Rng & Units 04/29/2022    4:43 AM 04/25/2022    9:07 AM 03/17/2022   12:02 PM  CBC  WBC 4.0 - 10.5 K/uL 11.7  9.2    Hemoglobin 12.0 - 15.0 g/dL 7.3  9.3    Hematocrit 36.0 - 46.0 % 23.6  30.4  28.1   Platelets 150 - 400 K/uL 213  233          Latest Ref Rng & Units 03/17/2022   12:01 PM  CMP  Glucose 70 - 99 mg/dL 73   BUN 6 - 20 mg/dL 7   Creatinine 0.44 - 1.00 mg/dL 0.51   Sodium 135 - 145 mmol/L 137   Potassium 3.5 - 5.1 mmol/L 3.7   Chloride 98 - 111 mmol/L 107  CO2 22 - 32 mmol/L 23   Calcium 8.9 - 10.3 mg/dL 8.6   Total Protein 6.5 - 8.1 g/dL 6.7   Total Bilirubin 0.3 - 1.2 mg/dL 0.5   Alkaline Phos 38 - 126 U/L 94   AST 15 - 41 U/L 15   ALT 0 - 44 U/L 11    Edinburgh Score:    04/28/2022   11:15 AM  Edinburgh Postnatal Depression Scale Screening Tool  I have been able to laugh and see the funny side of things. 0  I have looked forward with enjoyment to things. 0  I have blamed myself unnecessarily when things went wrong. 0  I have been anxious or worried for no good reason. 0  I have felt scared or panicky for no good reason. 0  Things have been getting on top of me. 0  I have been so unhappy that I have had difficulty sleeping. 0  I have felt sad or miserable. 0  I have been so unhappy that I have been crying. 0  The thought of harming myself has occurred to me. 0  Edinburgh Postnatal Depression Scale Total 0     After visit meds:  Allergies as of 04/30/2022       Reactions    Iron Shortness Of Breath   Had a reaction when she was receiving an iron transfusion        Medication List     STOP taking these medications    aspirin EC 81 MG tablet       TAKE these medications    ferrous sulfate 325 (65 FE) MG tablet Take 325 mg by mouth daily.   folic acid 1 MG tablet Commonly known as: FOLVITE TAKE 1 TABLET(1 MG) BY MOUTH DAILY   ibuprofen 600 MG tablet Commonly known as: ADVIL Take 1 tablet (600 mg total) by mouth every 6 (six) hours as needed for mild pain.   oxyCODONE-acetaminophen 5-325 MG tablet Commonly known as: PERCOCET/ROXICET Take 1-2 tablets by mouth every 4 (four) hours as needed for moderate pain.   PRENATAL PO Take 1 tablet by mouth daily.       Discharge home in stable condition Infant Feeding: Breast Infant Disposition:home with mother Discharge instruction: per After Visit Summary and Postpartum booklet. Activity: Advance as tolerated. Pelvic rest for 6 weeks.  Diet: routine diet Future Appointments:No future appointments. Follow up Visit:  Follow-up Information     Dillard, Naima, MD. Schedule an appointment as soon as possible for a visit in 1 week(s).   Specialty: Obstetrics and Gynecology Why: 1 week for incision check 6 weeks for postpartum check Contact information: [address] [place] [address] [phone]                Anticipated Birth Control:  IUD   04/30/2022 Archie Endo, MD

## 2022-04-30 NOTE — Plan of Care (Signed)
  Problem: Clinical Measurements: Goal: Cardiovascular complication will be avoided Outcome: Progressing   Problem: Pain Managment: Goal: General experience of comfort will improve Outcome: Progressing   Problem: Education: Goal: Knowledge of the prescribed therapeutic regimen will improve Outcome: Progressing Goal: Understanding of sexual limitations or changes related to disease process or condition will improve Outcome: Progressing Goal: Individualized Educational Video(s) Outcome: Progressing   Problem: Self-Concept: Goal: Communication of feelings regarding changes in body function or appearance will improve Outcome: Progressing   Problem: Skin Integrity: Goal: Demonstration of wound healing without infection will improve Outcome: Progressing   Problem: Activity: Goal: Will verbalize the importance of balancing activity with adequate rest periods Outcome: Progressing   Problem: Education: Goal: Knowledge of condition will improve Outcome: Progressing Goal: Individualized Educational Video(s) Outcome: Progressing

## 2022-04-30 NOTE — Plan of Care (Signed)
  Problem: Clinical Measurements: Goal: Cardiovascular complication will be avoided 04/30/2022 1311 by Rodolph Bong, LPN Outcome: Adequate for Discharge 04/30/2022 [id number] by Rodolph Bong, LPN Outcome: Progressing   Problem: Pain Managment: Goal: General experience of comfort will improve 04/30/2022 1311 by Rodolph Bong, LPN Outcome: Adequate for Discharge 04/30/2022 0807 by Rodolph Bong, LPN Outcome: Progressing   Problem: Education: Goal: Knowledge of the prescribed therapeutic regimen will improve 04/30/2022 1311 by Rodolph Bong, LPN Outcome: Adequate for Discharge 04/30/2022 0807 by Rodolph Bong, LPN Outcome: Progressing Goal: Understanding of sexual limitations or changes related to disease process or condition will improve 04/30/2022 1311 by Rodolph Bong, LPN Outcome: Adequate for Discharge 04/30/2022 0807 by Rodolph Bong, LPN Outcome: Progressing Goal: Individualized Educational Video(s) 04/30/2022 1311 by Rodolph Bong, LPN Outcome: Adequate for Discharge 04/30/2022 0807 by Rodolph Bong, LPN Outcome: Progressing   Problem: Self-Concept: Goal: Communication of feelings regarding changes in body function or appearance will improve 04/30/2022 1311 by Rodolph Bong, LPN Outcome: Adequate for Discharge 04/30/2022 0807 by Rodolph Bong, LPN Outcome: Progressing   Problem: Skin Integrity: Goal: Demonstration of wound healing without infection will improve 04/30/2022 1311 by Rodolph Bong, LPN Outcome: Adequate for Discharge 04/30/2022 0807 by Rodolph Bong, LPN Outcome: Progressing   Problem: Activity: Goal: Will verbalize the importance of balancing activity with adequate rest periods 04/30/2022 1311 by Rodolph Bong, LPN Outcome: Adequate for Discharge 04/30/2022 3151 by Rodolph Bong, LPN Outcome: Progressing   Problem: Education: Goal: Knowledge of condition will improve 04/30/2022 1311 by Rodolph Bong, LPN Outcome: Adequate for Discharge 04/30/2022  0807 by Rodolph Bong, LPN Outcome: Progressing Goal: Individualized Educational Video(s) 04/30/2022 1311 by Rodolph Bong, LPN Outcome: Adequate for Discharge 04/30/2022 0807 by Rodolph Bong, LPN Outcome: Progressing

## 2022-05-08 ENCOUNTER — Telehealth (HOSPITAL_COMMUNITY): Payer: Self-pay | Admitting: *Deleted

## 2022-05-08 NOTE — Telephone Encounter (Signed)
Left phone voicemail message.  Odis Hollingshead, RN 05-08-2022 at 3:10pm
